# Patient Record
Sex: Female | Born: 1935 | Race: White | Hispanic: No | Marital: Single | Smoking: Never smoker
Health system: Southern US, Community
[De-identification: ages and names within clinical notes are randomized; demographics above are authoritative.]

## PROBLEM LIST (undated history)

## (undated) DIAGNOSIS — C8330 Diffuse large B-cell lymphoma, unspecified site: Secondary | ICD-10-CM

## (undated) DIAGNOSIS — N189 Chronic kidney disease, unspecified: Secondary | ICD-10-CM

## (undated) DIAGNOSIS — I10 Essential (primary) hypertension: Secondary | ICD-10-CM

## (undated) DIAGNOSIS — M109 Gout, unspecified: Secondary | ICD-10-CM

## (undated) DIAGNOSIS — E039 Hypothyroidism, unspecified: Secondary | ICD-10-CM

## (undated) HISTORY — DX: Essential (primary) hypertension: I10

## (undated) HISTORY — PX: OTHER SURGICAL HISTORY: SHX169

## (undated) HISTORY — DX: Gout, unspecified: M10.9

## (undated) HISTORY — DX: Hypothyroidism, unspecified: E03.9

## (undated) HISTORY — DX: Diffuse large B-cell lymphoma, unspecified site: C83.30

## (undated) HISTORY — DX: Chronic kidney disease, unspecified: N18.9

---

## 2014-05-07 ENCOUNTER — Encounter (HOSPITAL_COMMUNITY): Payer: Self-pay | Admitting: Pharmacy Technician

## 2014-05-09 NOTE — Discharge Instructions (Signed)
Cristina Gregory  05/09/2014     Instructions    Activity: No Restrictions.   Diet: Resume Diet you were on at home.   Pain Medication: Tylenol if Needed.   CONTACT YOUR DOCTOR IF YOU HAVE PAIN, REDNESS IN YOUR EYE, OR DECREASED VISION.   Follow-up:06/03/2014 at 2:45  with Williams Che, MD.    Dr. Iona Hansen: [phone]           If you find that you cannot contact your physician, but feel that your signs and   Symptoms warrant a physician's attention, call the Emergency Room at   [phone] ext.[phone].

## 2014-05-12 ENCOUNTER — Ambulatory Visit (HOSPITAL_COMMUNITY)
Admission: RE | Admit: 2014-05-12 | Discharge: 2014-05-12 | Disposition: A | Discharge status: Home / Self Care | Payer: PRIVATE HEALTH INSURANCE | Source: Ambulatory Visit | Attending: Ophthalmology | Admitting: Ophthalmology

## 2014-05-12 ENCOUNTER — Encounter (HOSPITAL_COMMUNITY)
Admission: RE | Disposition: A | Discharge status: Home / Self Care | Payer: Self-pay | Source: Ambulatory Visit | Attending: Ophthalmology

## 2014-05-12 ENCOUNTER — Encounter (HOSPITAL_COMMUNITY): Payer: Self-pay | Admitting: *Deleted

## 2014-05-12 DIAGNOSIS — H4089 Other specified glaucoma: Secondary | ICD-10-CM | POA: Insufficient documentation

## 2014-05-12 HISTORY — PX: SLT LASER APPLICATION: SHX6099

## 2014-05-12 SURGERY — SLT LASER APPLICATION
Anesthesia: LOCAL | Laterality: Right

## 2014-05-12 MED ORDER — APRACLONIDINE HCL 1 % OP SOLN
1.0000 [drp] | OPHTHALMIC | Status: AC
  Administered 2014-05-12 (×3): 1 [drp] via OPHTHALMIC

## 2014-05-12 MED ORDER — PILOCARPINE HCL 1 % OP SOLN
OPHTHALMIC | Status: AC
  Filled 2014-05-12: qty 15

## 2014-05-12 MED ORDER — APRACLONIDINE HCL 1 % OP SOLN
OPHTHALMIC | Status: AC
  Filled 2014-05-12: qty 0.1

## 2014-05-12 MED ORDER — TETRACAINE HCL 0.5 % OP SOLN
1.0000 [drp] | Freq: Once | OPHTHALMIC | Status: AC
  Administered 2014-05-12: 1 [drp] via OPHTHALMIC

## 2014-05-12 MED ORDER — PILOCARPINE HCL 1 % OP SOLN
2.0000 [drp] | Freq: Once | OPHTHALMIC | Status: AC
  Administered 2014-05-12: 2 [drp] via OPHTHALMIC

## 2014-05-12 MED ORDER — TETRACAINE HCL 0.5 % OP SOLN
OPHTHALMIC | Status: AC
  Filled 2014-05-12: qty 2

## 2014-05-12 NOTE — Brief Op Note (Signed)
Cristina Gregory 05/12/2014  Williams Che, MD  Yag Laser Self Test CompletedYes.  . Procedure: SLT, right eye.  Eye Protection Worn by Staff Yes.  . Laser In Use Sign on Door Yes.  .  Laser: Nd:YAG Spot Size: Fixed Power Setting: 0.9 mJ/burst Position treated: 360 degrees Number of shots: 92 Total energy delivered: 82.8 mJ  The patient tolerated the procedure without difficulty. No complications were encountered.  Tenometer reading immediately after procedure: 22  mmHg.  The patient was discharged home with the instructions to continue all her current glaucoma medications, if any.   Patient verbalizes understanding of discharge instructions Yes.  .   Notes:gonioscopy:  grade 2 plateau 360 degrees,  mild pigmentation in angle, no recessions or synechiae

## 2014-05-12 NOTE — Progress Notes (Signed)
Tono-Pen XL pressure reading 22

## 2014-05-12 NOTE — H&P (Signed)
I have reviewed the pre printed H&P, the patient was re-examined, and I have identified no significant interval changes in the patient's medical condition.  There is no change in the plan of care since the history and physical of record. 

## 2014-05-15 ENCOUNTER — Encounter (HOSPITAL_COMMUNITY): Payer: Self-pay | Admitting: Ophthalmology

## 2014-11-03 DIAGNOSIS — E780 Pure hypercholesterolemia: Secondary | ICD-10-CM | POA: Diagnosis not present

## 2014-11-03 DIAGNOSIS — I10 Essential (primary) hypertension: Secondary | ICD-10-CM | POA: Diagnosis not present

## 2014-11-03 DIAGNOSIS — E1129 Type 2 diabetes mellitus with other diabetic kidney complication: Secondary | ICD-10-CM | POA: Diagnosis not present

## 2014-11-03 DIAGNOSIS — Z7689 Persons encountering health services in other specified circumstances: Secondary | ICD-10-CM | POA: Diagnosis not present

## 2014-11-03 DIAGNOSIS — E039 Hypothyroidism, unspecified: Secondary | ICD-10-CM | POA: Diagnosis not present

## 2014-11-03 DIAGNOSIS — N183 Chronic kidney disease, stage 3 (moderate): Secondary | ICD-10-CM | POA: Diagnosis not present

## 2014-11-10 DIAGNOSIS — N189 Chronic kidney disease, unspecified: Secondary | ICD-10-CM | POA: Diagnosis not present

## 2014-11-10 DIAGNOSIS — I10 Essential (primary) hypertension: Secondary | ICD-10-CM | POA: Diagnosis not present

## 2014-11-10 DIAGNOSIS — Z1389 Encounter for screening for other disorder: Secondary | ICD-10-CM | POA: Diagnosis not present

## 2014-11-10 DIAGNOSIS — E039 Hypothyroidism, unspecified: Secondary | ICD-10-CM | POA: Diagnosis not present

## 2014-11-10 DIAGNOSIS — E782 Mixed hyperlipidemia: Secondary | ICD-10-CM | POA: Diagnosis not present

## 2014-11-10 DIAGNOSIS — E1129 Type 2 diabetes mellitus with other diabetic kidney complication: Secondary | ICD-10-CM | POA: Diagnosis not present

## 2015-02-23 DIAGNOSIS — E875 Hyperkalemia: Secondary | ICD-10-CM | POA: Diagnosis not present

## 2015-02-23 DIAGNOSIS — E780 Pure hypercholesterolemia: Secondary | ICD-10-CM | POA: Diagnosis not present

## 2015-02-23 DIAGNOSIS — E039 Hypothyroidism, unspecified: Secondary | ICD-10-CM | POA: Diagnosis not present

## 2015-02-23 DIAGNOSIS — E782 Mixed hyperlipidemia: Secondary | ICD-10-CM | POA: Diagnosis not present

## 2015-02-23 DIAGNOSIS — E1129 Type 2 diabetes mellitus with other diabetic kidney complication: Secondary | ICD-10-CM | POA: Diagnosis not present

## 2015-03-10 DIAGNOSIS — N189 Chronic kidney disease, unspecified: Secondary | ICD-10-CM | POA: Diagnosis not present

## 2015-03-10 DIAGNOSIS — E1129 Type 2 diabetes mellitus with other diabetic kidney complication: Secondary | ICD-10-CM | POA: Diagnosis not present

## 2015-03-10 DIAGNOSIS — E782 Mixed hyperlipidemia: Secondary | ICD-10-CM | POA: Diagnosis not present

## 2015-03-10 DIAGNOSIS — I10 Essential (primary) hypertension: Secondary | ICD-10-CM | POA: Diagnosis not present

## 2015-03-10 DIAGNOSIS — E039 Hypothyroidism, unspecified: Secondary | ICD-10-CM | POA: Diagnosis not present

## 2015-03-11 DIAGNOSIS — E1129 Type 2 diabetes mellitus with other diabetic kidney complication: Secondary | ICD-10-CM | POA: Diagnosis not present

## 2015-03-11 DIAGNOSIS — N189 Chronic kidney disease, unspecified: Secondary | ICD-10-CM | POA: Diagnosis not present

## 2015-03-31 DIAGNOSIS — L049 Acute lymphadenitis, unspecified: Secondary | ICD-10-CM | POA: Diagnosis not present

## 2015-04-09 DIAGNOSIS — E042 Nontoxic multinodular goiter: Secondary | ICD-10-CM | POA: Diagnosis not present

## 2015-04-09 DIAGNOSIS — L049 Acute lymphadenitis, unspecified: Secondary | ICD-10-CM | POA: Diagnosis not present

## 2015-04-11 DIAGNOSIS — E1129 Type 2 diabetes mellitus with other diabetic kidney complication: Secondary | ICD-10-CM | POA: Diagnosis not present

## 2015-04-11 DIAGNOSIS — N189 Chronic kidney disease, unspecified: Secondary | ICD-10-CM | POA: Diagnosis not present

## 2015-04-17 DIAGNOSIS — I700 Atherosclerosis of aorta: Secondary | ICD-10-CM | POA: Diagnosis not present

## 2015-04-17 DIAGNOSIS — I6529 Occlusion and stenosis of unspecified carotid artery: Secondary | ICD-10-CM | POA: Diagnosis not present

## 2015-04-17 DIAGNOSIS — N189 Chronic kidney disease, unspecified: Secondary | ICD-10-CM | POA: Diagnosis not present

## 2015-04-17 DIAGNOSIS — R590 Localized enlarged lymph nodes: Secondary | ICD-10-CM | POA: Diagnosis not present

## 2015-04-17 DIAGNOSIS — R599 Enlarged lymph nodes, unspecified: Secondary | ICD-10-CM | POA: Diagnosis not present

## 2015-04-22 DIAGNOSIS — L049 Acute lymphadenitis, unspecified: Secondary | ICD-10-CM | POA: Diagnosis not present

## 2015-04-29 DIAGNOSIS — R590 Localized enlarged lymph nodes: Secondary | ICD-10-CM | POA: Diagnosis not present

## 2015-05-05 DIAGNOSIS — I129 Hypertensive chronic kidney disease with stage 1 through stage 4 chronic kidney disease, or unspecified chronic kidney disease: Secondary | ICD-10-CM | POA: Diagnosis not present

## 2015-05-05 DIAGNOSIS — Z7902 Long term (current) use of antithrombotics/antiplatelets: Secondary | ICD-10-CM | POA: Diagnosis not present

## 2015-05-05 DIAGNOSIS — Z7982 Long term (current) use of aspirin: Secondary | ICD-10-CM | POA: Diagnosis not present

## 2015-05-05 DIAGNOSIS — R590 Localized enlarged lymph nodes: Secondary | ICD-10-CM | POA: Diagnosis not present

## 2015-05-05 DIAGNOSIS — E039 Hypothyroidism, unspecified: Secondary | ICD-10-CM | POA: Diagnosis not present

## 2015-05-05 DIAGNOSIS — C8331 Diffuse large B-cell lymphoma, lymph nodes of head, face, and neck: Secondary | ICD-10-CM | POA: Diagnosis not present

## 2015-05-05 DIAGNOSIS — Z79899 Other long term (current) drug therapy: Secondary | ICD-10-CM | POA: Diagnosis not present

## 2015-05-05 DIAGNOSIS — Z823 Family history of stroke: Secondary | ICD-10-CM | POA: Diagnosis not present

## 2015-05-05 DIAGNOSIS — E119 Type 2 diabetes mellitus without complications: Secondary | ICD-10-CM | POA: Diagnosis not present

## 2015-05-05 DIAGNOSIS — N183 Chronic kidney disease, stage 3 (moderate): Secondary | ICD-10-CM | POA: Diagnosis not present

## 2015-05-05 DIAGNOSIS — Z8673 Personal history of transient ischemic attack (TIA), and cerebral infarction without residual deficits: Secondary | ICD-10-CM | POA: Diagnosis not present

## 2015-05-05 DIAGNOSIS — Z8249 Family history of ischemic heart disease and other diseases of the circulatory system: Secondary | ICD-10-CM | POA: Diagnosis not present

## 2015-05-06 DIAGNOSIS — Z823 Family history of stroke: Secondary | ICD-10-CM | POA: Diagnosis not present

## 2015-05-06 DIAGNOSIS — D729 Disorder of white blood cells, unspecified: Secondary | ICD-10-CM | POA: Diagnosis not present

## 2015-05-06 DIAGNOSIS — Z8249 Family history of ischemic heart disease and other diseases of the circulatory system: Secondary | ICD-10-CM | POA: Diagnosis not present

## 2015-05-06 DIAGNOSIS — N183 Chronic kidney disease, stage 3 (moderate): Secondary | ICD-10-CM | POA: Diagnosis not present

## 2015-05-06 DIAGNOSIS — E039 Hypothyroidism, unspecified: Secondary | ICD-10-CM | POA: Diagnosis not present

## 2015-05-06 DIAGNOSIS — Z7902 Long term (current) use of antithrombotics/antiplatelets: Secondary | ICD-10-CM | POA: Diagnosis not present

## 2015-05-06 DIAGNOSIS — I10 Essential (primary) hypertension: Secondary | ICD-10-CM | POA: Diagnosis not present

## 2015-05-06 DIAGNOSIS — E119 Type 2 diabetes mellitus without complications: Secondary | ICD-10-CM | POA: Diagnosis not present

## 2015-05-06 DIAGNOSIS — C8510 Unspecified B-cell lymphoma, unspecified site: Secondary | ICD-10-CM | POA: Diagnosis not present

## 2015-05-06 DIAGNOSIS — Z7982 Long term (current) use of aspirin: Secondary | ICD-10-CM | POA: Diagnosis not present

## 2015-05-06 DIAGNOSIS — C8591 Non-Hodgkin lymphoma, unspecified, lymph nodes of head, face, and neck: Secondary | ICD-10-CM | POA: Diagnosis not present

## 2015-05-06 DIAGNOSIS — Z8673 Personal history of transient ischemic attack (TIA), and cerebral infarction without residual deficits: Secondary | ICD-10-CM | POA: Diagnosis not present

## 2015-05-06 DIAGNOSIS — R590 Localized enlarged lymph nodes: Secondary | ICD-10-CM | POA: Diagnosis not present

## 2015-05-06 DIAGNOSIS — Z79899 Other long term (current) drug therapy: Secondary | ICD-10-CM | POA: Diagnosis not present

## 2015-05-06 DIAGNOSIS — I129 Hypertensive chronic kidney disease with stage 1 through stage 4 chronic kidney disease, or unspecified chronic kidney disease: Secondary | ICD-10-CM | POA: Diagnosis not present

## 2015-05-06 DIAGNOSIS — C8330 Diffuse large B-cell lymphoma, unspecified site: Secondary | ICD-10-CM | POA: Diagnosis not present

## 2015-05-06 DIAGNOSIS — R7989 Other specified abnormal findings of blood chemistry: Secondary | ICD-10-CM | POA: Diagnosis not present

## 2015-05-06 DIAGNOSIS — C8331 Diffuse large B-cell lymphoma, lymph nodes of head, face, and neck: Secondary | ICD-10-CM | POA: Diagnosis not present

## 2015-05-06 DIAGNOSIS — R898 Other abnormal findings in specimens from other organs, systems and tissues: Secondary | ICD-10-CM | POA: Diagnosis not present

## 2015-05-11 DIAGNOSIS — E780 Pure hypercholesterolemia: Secondary | ICD-10-CM | POA: Diagnosis not present

## 2015-05-20 DIAGNOSIS — I10 Essential (primary) hypertension: Secondary | ICD-10-CM | POA: Diagnosis not present

## 2015-05-20 DIAGNOSIS — Z8673 Personal history of transient ischemic attack (TIA), and cerebral infarction without residual deficits: Secondary | ICD-10-CM | POA: Diagnosis not present

## 2015-05-20 DIAGNOSIS — C8330 Diffuse large B-cell lymphoma, unspecified site: Secondary | ICD-10-CM | POA: Diagnosis not present

## 2015-05-20 DIAGNOSIS — M109 Gout, unspecified: Secondary | ICD-10-CM | POA: Diagnosis not present

## 2015-05-20 DIAGNOSIS — R7309 Other abnormal glucose: Secondary | ICD-10-CM | POA: Diagnosis not present

## 2015-05-26 DIAGNOSIS — K6389 Other specified diseases of intestine: Secondary | ICD-10-CM | POA: Diagnosis not present

## 2015-05-26 DIAGNOSIS — R911 Solitary pulmonary nodule: Secondary | ICD-10-CM | POA: Diagnosis not present

## 2015-05-26 DIAGNOSIS — C8330 Diffuse large B-cell lymphoma, unspecified site: Secondary | ICD-10-CM | POA: Diagnosis not present

## 2015-05-26 DIAGNOSIS — K828 Other specified diseases of gallbladder: Secondary | ICD-10-CM | POA: Diagnosis not present

## 2015-05-26 DIAGNOSIS — R590 Localized enlarged lymph nodes: Secondary | ICD-10-CM | POA: Diagnosis not present

## 2015-05-27 DIAGNOSIS — M109 Gout, unspecified: Secondary | ICD-10-CM | POA: Diagnosis not present

## 2015-05-27 DIAGNOSIS — R7309 Other abnormal glucose: Secondary | ICD-10-CM | POA: Diagnosis not present

## 2015-05-27 DIAGNOSIS — N289 Disorder of kidney and ureter, unspecified: Secondary | ICD-10-CM | POA: Diagnosis not present

## 2015-05-27 DIAGNOSIS — C8330 Diffuse large B-cell lymphoma, unspecified site: Secondary | ICD-10-CM | POA: Diagnosis not present

## 2015-05-27 DIAGNOSIS — Z8673 Personal history of transient ischemic attack (TIA), and cerebral infarction without residual deficits: Secondary | ICD-10-CM | POA: Diagnosis not present

## 2015-05-28 DIAGNOSIS — I351 Nonrheumatic aortic (valve) insufficiency: Secondary | ICD-10-CM | POA: Diagnosis not present

## 2015-05-28 DIAGNOSIS — R011 Cardiac murmur, unspecified: Secondary | ICD-10-CM | POA: Diagnosis not present

## 2015-05-28 DIAGNOSIS — I517 Cardiomegaly: Secondary | ICD-10-CM | POA: Diagnosis not present

## 2015-05-28 DIAGNOSIS — C8330 Diffuse large B-cell lymphoma, unspecified site: Secondary | ICD-10-CM | POA: Diagnosis not present

## 2015-05-28 DIAGNOSIS — C8190 Hodgkin lymphoma, unspecified, unspecified site: Secondary | ICD-10-CM | POA: Diagnosis not present

## 2015-05-29 DIAGNOSIS — I129 Hypertensive chronic kidney disease with stage 1 through stage 4 chronic kidney disease, or unspecified chronic kidney disease: Secondary | ICD-10-CM | POA: Diagnosis not present

## 2015-05-29 DIAGNOSIS — E780 Pure hypercholesterolemia: Secondary | ICD-10-CM | POA: Diagnosis not present

## 2015-05-29 DIAGNOSIS — Z8249 Family history of ischemic heart disease and other diseases of the circulatory system: Secondary | ICD-10-CM | POA: Diagnosis not present

## 2015-05-29 DIAGNOSIS — Z79899 Other long term (current) drug therapy: Secondary | ICD-10-CM | POA: Diagnosis not present

## 2015-05-29 DIAGNOSIS — Z7902 Long term (current) use of antithrombotics/antiplatelets: Secondary | ICD-10-CM | POA: Diagnosis not present

## 2015-05-29 DIAGNOSIS — N183 Chronic kidney disease, stage 3 (moderate): Secondary | ICD-10-CM | POA: Diagnosis not present

## 2015-05-29 DIAGNOSIS — Z452 Encounter for adjustment and management of vascular access device: Secondary | ICD-10-CM | POA: Diagnosis not present

## 2015-05-29 DIAGNOSIS — E119 Type 2 diabetes mellitus without complications: Secondary | ICD-10-CM | POA: Diagnosis not present

## 2015-05-29 DIAGNOSIS — Z823 Family history of stroke: Secondary | ICD-10-CM | POA: Diagnosis not present

## 2015-05-29 DIAGNOSIS — E039 Hypothyroidism, unspecified: Secondary | ICD-10-CM | POA: Diagnosis not present

## 2015-05-29 DIAGNOSIS — C8590 Non-Hodgkin lymphoma, unspecified, unspecified site: Secondary | ICD-10-CM | POA: Diagnosis not present

## 2015-05-29 DIAGNOSIS — C8510 Unspecified B-cell lymphoma, unspecified site: Secondary | ICD-10-CM | POA: Diagnosis not present

## 2015-05-29 DIAGNOSIS — I10 Essential (primary) hypertension: Secondary | ICD-10-CM | POA: Diagnosis not present

## 2015-05-29 DIAGNOSIS — Z7982 Long term (current) use of aspirin: Secondary | ICD-10-CM | POA: Diagnosis not present

## 2015-05-29 DIAGNOSIS — M109 Gout, unspecified: Secondary | ICD-10-CM | POA: Diagnosis not present

## 2015-06-04 DIAGNOSIS — C8330 Diffuse large B-cell lymphoma, unspecified site: Secondary | ICD-10-CM | POA: Diagnosis not present

## 2015-06-05 DIAGNOSIS — C8330 Diffuse large B-cell lymphoma, unspecified site: Secondary | ICD-10-CM | POA: Diagnosis not present

## 2015-06-05 DIAGNOSIS — R112 Nausea with vomiting, unspecified: Secondary | ICD-10-CM | POA: Diagnosis not present

## 2015-06-08 DIAGNOSIS — C8330 Diffuse large B-cell lymphoma, unspecified site: Secondary | ICD-10-CM | POA: Diagnosis not present

## 2015-06-11 DIAGNOSIS — E780 Pure hypercholesterolemia: Secondary | ICD-10-CM | POA: Diagnosis not present

## 2015-06-12 DIAGNOSIS — C8330 Diffuse large B-cell lymphoma, unspecified site: Secondary | ICD-10-CM | POA: Diagnosis not present

## 2015-06-16 DIAGNOSIS — R112 Nausea with vomiting, unspecified: Secondary | ICD-10-CM | POA: Diagnosis not present

## 2015-06-16 DIAGNOSIS — R110 Nausea: Secondary | ICD-10-CM | POA: Diagnosis not present

## 2015-06-16 DIAGNOSIS — C8330 Diffuse large B-cell lymphoma, unspecified site: Secondary | ICD-10-CM | POA: Diagnosis not present

## 2015-06-16 DIAGNOSIS — E860 Dehydration: Secondary | ICD-10-CM | POA: Diagnosis not present

## 2015-06-25 DIAGNOSIS — N289 Disorder of kidney and ureter, unspecified: Secondary | ICD-10-CM | POA: Diagnosis not present

## 2015-06-25 DIAGNOSIS — C8330 Diffuse large B-cell lymphoma, unspecified site: Secondary | ICD-10-CM | POA: Diagnosis not present

## 2015-06-26 DIAGNOSIS — C8330 Diffuse large B-cell lymphoma, unspecified site: Secondary | ICD-10-CM | POA: Diagnosis not present

## 2015-06-26 DIAGNOSIS — D701 Agranulocytosis secondary to cancer chemotherapy: Secondary | ICD-10-CM | POA: Diagnosis not present

## 2015-07-03 DIAGNOSIS — C801 Malignant (primary) neoplasm, unspecified: Secondary | ICD-10-CM | POA: Diagnosis not present

## 2015-07-03 DIAGNOSIS — C8510 Unspecified B-cell lymphoma, unspecified site: Secondary | ICD-10-CM | POA: Diagnosis not present

## 2015-07-03 DIAGNOSIS — R509 Fever, unspecified: Secondary | ICD-10-CM | POA: Diagnosis not present

## 2015-07-03 DIAGNOSIS — D61810 Antineoplastic chemotherapy induced pancytopenia: Secondary | ICD-10-CM | POA: Diagnosis not present

## 2015-07-03 DIAGNOSIS — D709 Neutropenia, unspecified: Secondary | ICD-10-CM | POA: Diagnosis not present

## 2015-07-03 DIAGNOSIS — I10 Essential (primary) hypertension: Secondary | ICD-10-CM | POA: Diagnosis not present

## 2015-07-03 DIAGNOSIS — D61818 Other pancytopenia: Secondary | ICD-10-CM | POA: Diagnosis not present

## 2015-07-03 DIAGNOSIS — R630 Anorexia: Secondary | ICD-10-CM | POA: Diagnosis not present

## 2015-07-04 DIAGNOSIS — T451X5A Adverse effect of antineoplastic and immunosuppressive drugs, initial encounter: Secondary | ICD-10-CM | POA: Diagnosis not present

## 2015-07-04 DIAGNOSIS — D61818 Other pancytopenia: Secondary | ICD-10-CM | POA: Diagnosis not present

## 2015-07-04 DIAGNOSIS — R262 Difficulty in walking, not elsewhere classified: Secondary | ICD-10-CM | POA: Diagnosis not present

## 2015-07-04 DIAGNOSIS — R630 Anorexia: Secondary | ICD-10-CM | POA: Diagnosis not present

## 2015-07-04 DIAGNOSIS — Z7952 Long term (current) use of systemic steroids: Secondary | ICD-10-CM | POA: Diagnosis not present

## 2015-07-04 DIAGNOSIS — C8510 Unspecified B-cell lymphoma, unspecified site: Secondary | ICD-10-CM | POA: Diagnosis not present

## 2015-07-04 DIAGNOSIS — Z79899 Other long term (current) drug therapy: Secondary | ICD-10-CM | POA: Diagnosis not present

## 2015-07-04 DIAGNOSIS — D709 Neutropenia, unspecified: Secondary | ICD-10-CM | POA: Diagnosis not present

## 2015-07-04 DIAGNOSIS — Z7902 Long term (current) use of antithrombotics/antiplatelets: Secondary | ICD-10-CM | POA: Diagnosis not present

## 2015-07-04 DIAGNOSIS — D61810 Antineoplastic chemotherapy induced pancytopenia: Secondary | ICD-10-CM | POA: Diagnosis not present

## 2015-07-04 DIAGNOSIS — E039 Hypothyroidism, unspecified: Secondary | ICD-10-CM | POA: Diagnosis not present

## 2015-07-04 DIAGNOSIS — C801 Malignant (primary) neoplasm, unspecified: Secondary | ICD-10-CM | POA: Diagnosis not present

## 2015-07-04 DIAGNOSIS — M1A9XX0 Chronic gout, unspecified, without tophus (tophi): Secondary | ICD-10-CM | POA: Diagnosis not present

## 2015-07-04 DIAGNOSIS — R509 Fever, unspecified: Secondary | ICD-10-CM | POA: Diagnosis not present

## 2015-07-04 DIAGNOSIS — Z8673 Personal history of transient ischemic attack (TIA), and cerebral infarction without residual deficits: Secondary | ICD-10-CM | POA: Diagnosis not present

## 2015-07-04 DIAGNOSIS — E780 Pure hypercholesterolemia: Secondary | ICD-10-CM | POA: Diagnosis not present

## 2015-07-04 DIAGNOSIS — I10 Essential (primary) hypertension: Secondary | ICD-10-CM | POA: Diagnosis not present

## 2015-07-04 DIAGNOSIS — C8300 Small cell B-cell lymphoma, unspecified site: Secondary | ICD-10-CM | POA: Diagnosis not present

## 2015-07-04 DIAGNOSIS — D702 Other drug-induced agranulocytosis: Secondary | ICD-10-CM | POA: Diagnosis not present

## 2015-07-04 DIAGNOSIS — Z7982 Long term (current) use of aspirin: Secondary | ICD-10-CM | POA: Diagnosis not present

## 2015-07-04 DIAGNOSIS — M109 Gout, unspecified: Secondary | ICD-10-CM | POA: Diagnosis not present

## 2015-07-04 DIAGNOSIS — E785 Hyperlipidemia, unspecified: Secondary | ICD-10-CM | POA: Diagnosis not present

## 2015-07-04 DIAGNOSIS — M6281 Muscle weakness (generalized): Secondary | ICD-10-CM | POA: Diagnosis not present

## 2015-07-04 DIAGNOSIS — B370 Candidal stomatitis: Secondary | ICD-10-CM | POA: Diagnosis not present

## 2015-07-06 DIAGNOSIS — D61810 Antineoplastic chemotherapy induced pancytopenia: Secondary | ICD-10-CM | POA: Diagnosis not present

## 2015-07-06 DIAGNOSIS — C8330 Diffuse large B-cell lymphoma, unspecified site: Secondary | ICD-10-CM | POA: Diagnosis not present

## 2015-07-06 DIAGNOSIS — M6281 Muscle weakness (generalized): Secondary | ICD-10-CM | POA: Diagnosis not present

## 2015-07-06 DIAGNOSIS — D729 Disorder of white blood cells, unspecified: Secondary | ICD-10-CM | POA: Diagnosis not present

## 2015-07-06 DIAGNOSIS — B370 Candidal stomatitis: Secondary | ICD-10-CM | POA: Diagnosis not present

## 2015-07-06 DIAGNOSIS — C8510 Unspecified B-cell lymphoma, unspecified site: Secondary | ICD-10-CM | POA: Diagnosis not present

## 2015-07-06 DIAGNOSIS — D61818 Other pancytopenia: Secondary | ICD-10-CM | POA: Diagnosis not present

## 2015-07-06 DIAGNOSIS — R262 Difficulty in walking, not elsewhere classified: Secondary | ICD-10-CM | POA: Diagnosis not present

## 2015-07-06 DIAGNOSIS — R5383 Other fatigue: Secondary | ICD-10-CM | POA: Diagnosis not present

## 2015-07-06 DIAGNOSIS — D701 Agranulocytosis secondary to cancer chemotherapy: Secondary | ICD-10-CM | POA: Diagnosis not present

## 2015-07-06 DIAGNOSIS — I639 Cerebral infarction, unspecified: Secondary | ICD-10-CM | POA: Diagnosis not present

## 2015-07-06 DIAGNOSIS — D702 Other drug-induced agranulocytosis: Secondary | ICD-10-CM | POA: Diagnosis not present

## 2015-07-06 DIAGNOSIS — N289 Disorder of kidney and ureter, unspecified: Secondary | ICD-10-CM | POA: Diagnosis not present

## 2015-07-06 DIAGNOSIS — E785 Hyperlipidemia, unspecified: Secondary | ICD-10-CM | POA: Diagnosis not present

## 2015-07-06 DIAGNOSIS — E039 Hypothyroidism, unspecified: Secondary | ICD-10-CM | POA: Diagnosis not present

## 2015-07-06 DIAGNOSIS — C8300 Small cell B-cell lymphoma, unspecified site: Secondary | ICD-10-CM | POA: Diagnosis not present

## 2015-07-06 DIAGNOSIS — M1A9XX0 Chronic gout, unspecified, without tophus (tophi): Secondary | ICD-10-CM | POA: Diagnosis not present

## 2015-07-06 DIAGNOSIS — Z8673 Personal history of transient ischemic attack (TIA), and cerebral infarction without residual deficits: Secondary | ICD-10-CM | POA: Diagnosis not present

## 2015-07-06 DIAGNOSIS — R938 Abnormal findings on diagnostic imaging of other specified body structures: Secondary | ICD-10-CM | POA: Diagnosis not present

## 2015-07-06 DIAGNOSIS — I10 Essential (primary) hypertension: Secondary | ICD-10-CM | POA: Diagnosis not present

## 2015-07-07 DIAGNOSIS — C8510 Unspecified B-cell lymphoma, unspecified site: Secondary | ICD-10-CM | POA: Diagnosis not present

## 2015-07-07 DIAGNOSIS — R5383 Other fatigue: Secondary | ICD-10-CM | POA: Diagnosis not present

## 2015-07-07 DIAGNOSIS — E785 Hyperlipidemia, unspecified: Secondary | ICD-10-CM | POA: Diagnosis not present

## 2015-07-07 DIAGNOSIS — I10 Essential (primary) hypertension: Secondary | ICD-10-CM | POA: Diagnosis not present

## 2015-07-08 ENCOUNTER — Other Ambulatory Visit: Payer: Self-pay | Admitting: *Deleted

## 2015-07-08 ENCOUNTER — Encounter: Payer: Self-pay | Admitting: *Deleted

## 2015-07-08 NOTE — Patient Outreach (Signed)
[facility]) Care Management  07/08/2015  Cristina Gregory 12-24-1934 [id number]   Referral from [place], assigned Jacqlyn Larsen, RN to outreach.  Thanks, Ronnell Freshwater. [place], [place] Assistant Phone: [phone] Fax: [phone]

## 2015-07-08 NOTE — Patient Outreach (Signed)
07/08/15- Referral received from [facility], pt discharged from [facility] on 07/05/15, telephone call to patient, HIIPA verified, pt reports she lives out of state in [place] and has been discharged to skilled nursing facility in [place] (pt thinks this is the name of facility but not absolutely sure) and states she will be there for awhile as she is not able to care for herself at home as she is too weak.  Pt reports she is receiving all her medications at the facility. RN CM ask about speaking to someone at facility and pt "doesn't know" as she is not exactly sure who [facility] is. Case closed due to pt being inpatient at skilled facility.  RN CM faxed letter to primary MD Dr. Quintin Alto and informed him case not opened and pt is at facility.  Jacqlyn Larsen [facility], [place] Coordinator [phone]

## 2015-07-10 NOTE — Patient Outreach (Signed)
[facility]) Care Management  07/10/2015  SHARNELL KNIGHT 1935/09/08 [id number]   Notification from Jacqlyn Larsen, RN due to patient resides in facility in [place].  Thanks, Ronnell Freshwater. [place], [place] Assistant Phone: [phone] Fax: [phone]

## 2015-07-12 DIAGNOSIS — I10 Essential (primary) hypertension: Secondary | ICD-10-CM | POA: Diagnosis not present

## 2015-07-17 DIAGNOSIS — D701 Agranulocytosis secondary to cancer chemotherapy: Secondary | ICD-10-CM | POA: Diagnosis not present

## 2015-07-17 DIAGNOSIS — B370 Candidal stomatitis: Secondary | ICD-10-CM | POA: Diagnosis not present

## 2015-07-17 DIAGNOSIS — D729 Disorder of white blood cells, unspecified: Secondary | ICD-10-CM | POA: Diagnosis not present

## 2015-07-17 DIAGNOSIS — C8330 Diffuse large B-cell lymphoma, unspecified site: Secondary | ICD-10-CM | POA: Diagnosis not present

## 2015-07-21 DIAGNOSIS — E785 Hyperlipidemia, unspecified: Secondary | ICD-10-CM | POA: Diagnosis not present

## 2015-07-21 DIAGNOSIS — C8510 Unspecified B-cell lymphoma, unspecified site: Secondary | ICD-10-CM | POA: Diagnosis not present

## 2015-07-21 DIAGNOSIS — E039 Hypothyroidism, unspecified: Secondary | ICD-10-CM | POA: Diagnosis not present

## 2015-07-21 DIAGNOSIS — B370 Candidal stomatitis: Secondary | ICD-10-CM | POA: Diagnosis not present

## 2015-07-21 DIAGNOSIS — I639 Cerebral infarction, unspecified: Secondary | ICD-10-CM | POA: Diagnosis not present

## 2015-07-23 DIAGNOSIS — Z8673 Personal history of transient ischemic attack (TIA), and cerebral infarction without residual deficits: Secondary | ICD-10-CM | POA: Diagnosis not present

## 2015-07-23 DIAGNOSIS — N289 Disorder of kidney and ureter, unspecified: Secondary | ICD-10-CM | POA: Diagnosis not present

## 2015-07-23 DIAGNOSIS — C8330 Diffuse large B-cell lymphoma, unspecified site: Secondary | ICD-10-CM | POA: Diagnosis not present

## 2015-07-23 DIAGNOSIS — Z5111 Encounter for antineoplastic chemotherapy: Secondary | ICD-10-CM | POA: Diagnosis not present

## 2015-07-23 DIAGNOSIS — D701 Agranulocytosis secondary to cancer chemotherapy: Secondary | ICD-10-CM | POA: Diagnosis not present

## 2015-07-24 DIAGNOSIS — C8330 Diffuse large B-cell lymphoma, unspecified site: Secondary | ICD-10-CM | POA: Diagnosis not present

## 2015-07-24 DIAGNOSIS — D701 Agranulocytosis secondary to cancer chemotherapy: Secondary | ICD-10-CM | POA: Diagnosis not present

## 2015-07-30 DIAGNOSIS — C8330 Diffuse large B-cell lymphoma, unspecified site: Secondary | ICD-10-CM | POA: Diagnosis not present

## 2015-07-31 DIAGNOSIS — D701 Agranulocytosis secondary to cancer chemotherapy: Secondary | ICD-10-CM | POA: Diagnosis not present

## 2015-07-31 DIAGNOSIS — C8330 Diffuse large B-cell lymphoma, unspecified site: Secondary | ICD-10-CM | POA: Diagnosis not present

## 2015-08-03 DIAGNOSIS — N281 Cyst of kidney, acquired: Secondary | ICD-10-CM | POA: Diagnosis not present

## 2015-08-03 DIAGNOSIS — R911 Solitary pulmonary nodule: Secondary | ICD-10-CM | POA: Diagnosis not present

## 2015-08-03 DIAGNOSIS — C8330 Diffuse large B-cell lymphoma, unspecified site: Secondary | ICD-10-CM | POA: Diagnosis not present

## 2015-08-03 DIAGNOSIS — D701 Agranulocytosis secondary to cancer chemotherapy: Secondary | ICD-10-CM | POA: Diagnosis not present

## 2015-08-03 DIAGNOSIS — K6389 Other specified diseases of intestine: Secondary | ICD-10-CM | POA: Diagnosis not present

## 2015-08-03 DIAGNOSIS — E079 Disorder of thyroid, unspecified: Secondary | ICD-10-CM | POA: Diagnosis not present

## 2015-08-04 DIAGNOSIS — Z8673 Personal history of transient ischemic attack (TIA), and cerebral infarction without residual deficits: Secondary | ICD-10-CM | POA: Diagnosis not present

## 2015-08-04 DIAGNOSIS — R948 Abnormal results of function studies of other organs and systems: Secondary | ICD-10-CM | POA: Diagnosis not present

## 2015-08-04 DIAGNOSIS — C8330 Diffuse large B-cell lymphoma, unspecified site: Secondary | ICD-10-CM | POA: Diagnosis not present

## 2015-08-04 DIAGNOSIS — E069 Thyroiditis, unspecified: Secondary | ICD-10-CM | POA: Diagnosis not present

## 2015-08-04 DIAGNOSIS — D701 Agranulocytosis secondary to cancer chemotherapy: Secondary | ICD-10-CM | POA: Diagnosis not present

## 2015-08-11 DIAGNOSIS — E039 Hypothyroidism, unspecified: Secondary | ICD-10-CM | POA: Diagnosis not present

## 2015-08-13 DIAGNOSIS — C8331 Diffuse large B-cell lymphoma, lymph nodes of head, face, and neck: Secondary | ICD-10-CM | POA: Diagnosis not present

## 2015-08-13 DIAGNOSIS — C8330 Diffuse large B-cell lymphoma, unspecified site: Secondary | ICD-10-CM | POA: Diagnosis not present

## 2015-08-14 DIAGNOSIS — D701 Agranulocytosis secondary to cancer chemotherapy: Secondary | ICD-10-CM | POA: Diagnosis not present

## 2015-08-14 DIAGNOSIS — C8330 Diffuse large B-cell lymphoma, unspecified site: Secondary | ICD-10-CM | POA: Diagnosis not present

## 2015-08-14 DIAGNOSIS — T451X5A Adverse effect of antineoplastic and immunosuppressive drugs, initial encounter: Secondary | ICD-10-CM | POA: Diagnosis not present

## 2015-08-20 DIAGNOSIS — C8510 Unspecified B-cell lymphoma, unspecified site: Secondary | ICD-10-CM | POA: Diagnosis not present

## 2015-08-20 DIAGNOSIS — B370 Candidal stomatitis: Secondary | ICD-10-CM | POA: Diagnosis not present

## 2015-08-20 DIAGNOSIS — R1084 Generalized abdominal pain: Secondary | ICD-10-CM | POA: Diagnosis not present

## 2015-08-20 DIAGNOSIS — R531 Weakness: Secondary | ICD-10-CM | POA: Diagnosis not present

## 2015-08-20 DIAGNOSIS — D709 Neutropenia, unspecified: Secondary | ICD-10-CM | POA: Diagnosis not present

## 2015-08-20 DIAGNOSIS — R262 Difficulty in walking, not elsewhere classified: Secondary | ICD-10-CM | POA: Diagnosis not present

## 2015-08-20 DIAGNOSIS — R5081 Fever presenting with conditions classified elsewhere: Secondary | ICD-10-CM | POA: Diagnosis not present

## 2015-08-21 DIAGNOSIS — I129 Hypertensive chronic kidney disease with stage 1 through stage 4 chronic kidney disease, or unspecified chronic kidney disease: Secondary | ICD-10-CM | POA: Diagnosis not present

## 2015-08-21 DIAGNOSIS — R1084 Generalized abdominal pain: Secondary | ICD-10-CM | POA: Diagnosis not present

## 2015-08-21 DIAGNOSIS — C8330 Diffuse large B-cell lymphoma, unspecified site: Secondary | ICD-10-CM | POA: Diagnosis not present

## 2015-08-21 DIAGNOSIS — Z885 Allergy status to narcotic agent status: Secondary | ICD-10-CM | POA: Diagnosis not present

## 2015-08-21 DIAGNOSIS — R5081 Fever presenting with conditions classified elsewhere: Secondary | ICD-10-CM | POA: Diagnosis not present

## 2015-08-21 DIAGNOSIS — Z90710 Acquired absence of both cervix and uterus: Secondary | ICD-10-CM | POA: Diagnosis not present

## 2015-08-21 DIAGNOSIS — A419 Sepsis, unspecified organism: Secondary | ICD-10-CM | POA: Diagnosis not present

## 2015-08-21 DIAGNOSIS — Z7982 Long term (current) use of aspirin: Secondary | ICD-10-CM | POA: Diagnosis not present

## 2015-08-21 DIAGNOSIS — R531 Weakness: Secondary | ICD-10-CM | POA: Diagnosis not present

## 2015-08-21 DIAGNOSIS — D709 Neutropenia, unspecified: Secondary | ICD-10-CM | POA: Diagnosis not present

## 2015-08-21 DIAGNOSIS — C8510 Unspecified B-cell lymphoma, unspecified site: Secondary | ICD-10-CM | POA: Diagnosis not present

## 2015-08-21 DIAGNOSIS — Z7902 Long term (current) use of antithrombotics/antiplatelets: Secondary | ICD-10-CM | POA: Diagnosis not present

## 2015-08-21 DIAGNOSIS — E785 Hyperlipidemia, unspecified: Secondary | ICD-10-CM | POA: Diagnosis not present

## 2015-08-21 DIAGNOSIS — M6281 Muscle weakness (generalized): Secondary | ICD-10-CM | POA: Diagnosis not present

## 2015-08-21 DIAGNOSIS — D6959 Other secondary thrombocytopenia: Secondary | ICD-10-CM | POA: Diagnosis not present

## 2015-08-21 DIAGNOSIS — D701 Agranulocytosis secondary to cancer chemotherapy: Secondary | ICD-10-CM | POA: Diagnosis not present

## 2015-08-21 DIAGNOSIS — Z8673 Personal history of transient ischemic attack (TIA), and cerebral infarction without residual deficits: Secondary | ICD-10-CM | POA: Diagnosis not present

## 2015-08-21 DIAGNOSIS — N189 Chronic kidney disease, unspecified: Secondary | ICD-10-CM | POA: Diagnosis not present

## 2015-08-21 DIAGNOSIS — B370 Candidal stomatitis: Secondary | ICD-10-CM | POA: Diagnosis not present

## 2015-08-21 DIAGNOSIS — M109 Gout, unspecified: Secondary | ICD-10-CM | POA: Diagnosis not present

## 2015-08-21 DIAGNOSIS — Z2821 Immunization not carried out because of patient refusal: Secondary | ICD-10-CM | POA: Diagnosis not present

## 2015-08-21 DIAGNOSIS — Z79891 Long term (current) use of opiate analgesic: Secondary | ICD-10-CM | POA: Diagnosis not present

## 2015-08-21 DIAGNOSIS — Z79899 Other long term (current) drug therapy: Secondary | ICD-10-CM | POA: Diagnosis not present

## 2015-08-21 DIAGNOSIS — E039 Hypothyroidism, unspecified: Secondary | ICD-10-CM | POA: Diagnosis not present

## 2015-08-21 DIAGNOSIS — Z7952 Long term (current) use of systemic steroids: Secondary | ICD-10-CM | POA: Diagnosis not present

## 2015-08-25 DIAGNOSIS — R7989 Other specified abnormal findings of blood chemistry: Secondary | ICD-10-CM | POA: Diagnosis not present

## 2015-08-25 DIAGNOSIS — E876 Hypokalemia: Secondary | ICD-10-CM | POA: Diagnosis not present

## 2015-08-25 DIAGNOSIS — N289 Disorder of kidney and ureter, unspecified: Secondary | ICD-10-CM | POA: Diagnosis not present

## 2015-08-25 DIAGNOSIS — C8330 Diffuse large B-cell lymphoma, unspecified site: Secondary | ICD-10-CM | POA: Diagnosis not present

## 2015-08-25 DIAGNOSIS — D729 Disorder of white blood cells, unspecified: Secondary | ICD-10-CM | POA: Diagnosis not present

## 2015-08-26 DIAGNOSIS — D709 Neutropenia, unspecified: Secondary | ICD-10-CM | POA: Diagnosis not present

## 2015-08-26 DIAGNOSIS — C8590 Non-Hodgkin lymphoma, unspecified, unspecified site: Secondary | ICD-10-CM | POA: Diagnosis not present

## 2015-08-26 DIAGNOSIS — D61818 Other pancytopenia: Secondary | ICD-10-CM | POA: Diagnosis not present

## 2015-08-26 DIAGNOSIS — M6281 Muscle weakness (generalized): Secondary | ICD-10-CM | POA: Diagnosis not present

## 2015-08-27 DIAGNOSIS — D709 Neutropenia, unspecified: Secondary | ICD-10-CM | POA: Diagnosis not present

## 2015-08-27 DIAGNOSIS — D61818 Other pancytopenia: Secondary | ICD-10-CM | POA: Diagnosis not present

## 2015-08-27 DIAGNOSIS — C8590 Non-Hodgkin lymphoma, unspecified, unspecified site: Secondary | ICD-10-CM | POA: Diagnosis not present

## 2015-08-27 DIAGNOSIS — M6281 Muscle weakness (generalized): Secondary | ICD-10-CM | POA: Diagnosis not present

## 2015-08-28 DIAGNOSIS — D61818 Other pancytopenia: Secondary | ICD-10-CM | POA: Diagnosis not present

## 2015-08-28 DIAGNOSIS — M6281 Muscle weakness (generalized): Secondary | ICD-10-CM | POA: Diagnosis not present

## 2015-08-28 DIAGNOSIS — C8590 Non-Hodgkin lymphoma, unspecified, unspecified site: Secondary | ICD-10-CM | POA: Diagnosis not present

## 2015-08-28 DIAGNOSIS — D709 Neutropenia, unspecified: Secondary | ICD-10-CM | POA: Diagnosis not present

## 2015-08-31 DIAGNOSIS — B370 Candidal stomatitis: Secondary | ICD-10-CM | POA: Diagnosis not present

## 2015-08-31 DIAGNOSIS — Z8673 Personal history of transient ischemic attack (TIA), and cerebral infarction without residual deficits: Secondary | ICD-10-CM | POA: Diagnosis not present

## 2015-08-31 DIAGNOSIS — R7989 Other specified abnormal findings of blood chemistry: Secondary | ICD-10-CM | POA: Diagnosis not present

## 2015-08-31 DIAGNOSIS — D729 Disorder of white blood cells, unspecified: Secondary | ICD-10-CM | POA: Diagnosis not present

## 2015-08-31 DIAGNOSIS — D701 Agranulocytosis secondary to cancer chemotherapy: Secondary | ICD-10-CM | POA: Diagnosis not present

## 2015-08-31 DIAGNOSIS — R531 Weakness: Secondary | ICD-10-CM | POA: Diagnosis not present

## 2015-08-31 DIAGNOSIS — C8330 Diffuse large B-cell lymphoma, unspecified site: Secondary | ICD-10-CM | POA: Diagnosis not present

## 2015-09-01 DIAGNOSIS — C8590 Non-Hodgkin lymphoma, unspecified, unspecified site: Secondary | ICD-10-CM | POA: Diagnosis not present

## 2015-09-01 DIAGNOSIS — M6281 Muscle weakness (generalized): Secondary | ICD-10-CM | POA: Diagnosis not present

## 2015-09-01 DIAGNOSIS — D61818 Other pancytopenia: Secondary | ICD-10-CM | POA: Diagnosis not present

## 2015-09-01 DIAGNOSIS — D709 Neutropenia, unspecified: Secondary | ICD-10-CM | POA: Diagnosis not present

## 2015-09-04 DIAGNOSIS — M6281 Muscle weakness (generalized): Secondary | ICD-10-CM | POA: Diagnosis not present

## 2015-09-04 DIAGNOSIS — D61818 Other pancytopenia: Secondary | ICD-10-CM | POA: Diagnosis not present

## 2015-09-04 DIAGNOSIS — D709 Neutropenia, unspecified: Secondary | ICD-10-CM | POA: Diagnosis not present

## 2015-09-04 DIAGNOSIS — C8590 Non-Hodgkin lymphoma, unspecified, unspecified site: Secondary | ICD-10-CM | POA: Diagnosis not present

## 2015-09-08 DIAGNOSIS — D61818 Other pancytopenia: Secondary | ICD-10-CM | POA: Diagnosis not present

## 2015-09-08 DIAGNOSIS — D709 Neutropenia, unspecified: Secondary | ICD-10-CM | POA: Diagnosis not present

## 2015-09-08 DIAGNOSIS — C8590 Non-Hodgkin lymphoma, unspecified, unspecified site: Secondary | ICD-10-CM | POA: Diagnosis not present

## 2015-09-08 DIAGNOSIS — M6281 Muscle weakness (generalized): Secondary | ICD-10-CM | POA: Diagnosis not present

## 2015-09-09 DIAGNOSIS — D709 Neutropenia, unspecified: Secondary | ICD-10-CM | POA: Diagnosis not present

## 2015-09-09 DIAGNOSIS — D61818 Other pancytopenia: Secondary | ICD-10-CM | POA: Diagnosis not present

## 2015-09-09 DIAGNOSIS — C8590 Non-Hodgkin lymphoma, unspecified, unspecified site: Secondary | ICD-10-CM | POA: Diagnosis not present

## 2015-09-09 DIAGNOSIS — M6281 Muscle weakness (generalized): Secondary | ICD-10-CM | POA: Diagnosis not present

## 2015-09-11 DIAGNOSIS — D709 Neutropenia, unspecified: Secondary | ICD-10-CM | POA: Diagnosis not present

## 2015-09-11 DIAGNOSIS — I10 Essential (primary) hypertension: Secondary | ICD-10-CM | POA: Diagnosis not present

## 2015-09-11 DIAGNOSIS — E039 Hypothyroidism, unspecified: Secondary | ICD-10-CM | POA: Diagnosis not present

## 2015-09-11 DIAGNOSIS — M6281 Muscle weakness (generalized): Secondary | ICD-10-CM | POA: Diagnosis not present

## 2015-09-11 DIAGNOSIS — C8590 Non-Hodgkin lymphoma, unspecified, unspecified site: Secondary | ICD-10-CM | POA: Diagnosis not present

## 2015-09-11 DIAGNOSIS — D61818 Other pancytopenia: Secondary | ICD-10-CM | POA: Diagnosis not present

## 2015-09-14 DIAGNOSIS — R531 Weakness: Secondary | ICD-10-CM | POA: Diagnosis not present

## 2015-09-14 DIAGNOSIS — C8330 Diffuse large B-cell lymphoma, unspecified site: Secondary | ICD-10-CM | POA: Diagnosis not present

## 2015-09-15 DIAGNOSIS — C8590 Non-Hodgkin lymphoma, unspecified, unspecified site: Secondary | ICD-10-CM | POA: Diagnosis not present

## 2015-09-15 DIAGNOSIS — D61818 Other pancytopenia: Secondary | ICD-10-CM | POA: Diagnosis not present

## 2015-09-15 DIAGNOSIS — M6281 Muscle weakness (generalized): Secondary | ICD-10-CM | POA: Diagnosis not present

## 2015-09-15 DIAGNOSIS — D709 Neutropenia, unspecified: Secondary | ICD-10-CM | POA: Diagnosis not present

## 2015-09-15 DIAGNOSIS — C320 Malignant neoplasm of glottis: Secondary | ICD-10-CM | POA: Diagnosis not present

## 2015-09-16 DIAGNOSIS — C8331 Diffuse large B-cell lymphoma, lymph nodes of head, face, and neck: Secondary | ICD-10-CM | POA: Diagnosis not present

## 2015-09-16 DIAGNOSIS — I10 Essential (primary) hypertension: Secondary | ICD-10-CM | POA: Diagnosis not present

## 2015-09-16 DIAGNOSIS — D709 Neutropenia, unspecified: Secondary | ICD-10-CM | POA: Diagnosis not present

## 2015-09-16 DIAGNOSIS — C320 Malignant neoplasm of glottis: Secondary | ICD-10-CM | POA: Diagnosis not present

## 2015-09-16 DIAGNOSIS — Z885 Allergy status to narcotic agent status: Secondary | ICD-10-CM | POA: Diagnosis not present

## 2015-09-16 DIAGNOSIS — I69354 Hemiplegia and hemiparesis following cerebral infarction affecting left non-dominant side: Secondary | ICD-10-CM | POA: Diagnosis not present

## 2015-09-16 DIAGNOSIS — D61818 Other pancytopenia: Secondary | ICD-10-CM | POA: Diagnosis not present

## 2015-09-16 DIAGNOSIS — C8590 Non-Hodgkin lymphoma, unspecified, unspecified site: Secondary | ICD-10-CM | POA: Diagnosis not present

## 2015-09-16 DIAGNOSIS — Z800 Family history of malignant neoplasm of digestive organs: Secondary | ICD-10-CM | POA: Diagnosis not present

## 2015-09-16 DIAGNOSIS — M6281 Muscle weakness (generalized): Secondary | ICD-10-CM | POA: Diagnosis not present

## 2015-09-16 DIAGNOSIS — E119 Type 2 diabetes mellitus without complications: Secondary | ICD-10-CM | POA: Diagnosis not present

## 2015-09-18 DIAGNOSIS — D709 Neutropenia, unspecified: Secondary | ICD-10-CM | POA: Diagnosis not present

## 2015-09-18 DIAGNOSIS — D61818 Other pancytopenia: Secondary | ICD-10-CM | POA: Diagnosis not present

## 2015-09-18 DIAGNOSIS — M6281 Muscle weakness (generalized): Secondary | ICD-10-CM | POA: Diagnosis not present

## 2015-09-18 DIAGNOSIS — C8590 Non-Hodgkin lymphoma, unspecified, unspecified site: Secondary | ICD-10-CM | POA: Diagnosis not present

## 2015-09-21 DIAGNOSIS — C320 Malignant neoplasm of glottis: Secondary | ICD-10-CM | POA: Diagnosis not present

## 2015-09-22 DIAGNOSIS — Z885 Allergy status to narcotic agent status: Secondary | ICD-10-CM | POA: Diagnosis not present

## 2015-09-22 DIAGNOSIS — I10 Essential (primary) hypertension: Secondary | ICD-10-CM | POA: Diagnosis not present

## 2015-09-22 DIAGNOSIS — E119 Type 2 diabetes mellitus without complications: Secondary | ICD-10-CM | POA: Diagnosis not present

## 2015-09-22 DIAGNOSIS — C8331 Diffuse large B-cell lymphoma, lymph nodes of head, face, and neck: Secondary | ICD-10-CM | POA: Diagnosis not present

## 2015-09-22 DIAGNOSIS — C320 Malignant neoplasm of glottis: Secondary | ICD-10-CM | POA: Diagnosis not present

## 2015-09-22 DIAGNOSIS — Z800 Family history of malignant neoplasm of digestive organs: Secondary | ICD-10-CM | POA: Diagnosis not present

## 2015-09-22 DIAGNOSIS — I69354 Hemiplegia and hemiparesis following cerebral infarction affecting left non-dominant side: Secondary | ICD-10-CM | POA: Diagnosis not present

## 2015-09-29 DIAGNOSIS — Z510 Encounter for antineoplastic radiation therapy: Secondary | ICD-10-CM | POA: Diagnosis not present

## 2015-09-29 DIAGNOSIS — C320 Malignant neoplasm of glottis: Secondary | ICD-10-CM | POA: Diagnosis not present

## 2015-09-29 DIAGNOSIS — C8330 Diffuse large B-cell lymphoma, unspecified site: Secondary | ICD-10-CM | POA: Diagnosis not present

## 2015-09-29 DIAGNOSIS — C8331 Diffuse large B-cell lymphoma, lymph nodes of head, face, and neck: Secondary | ICD-10-CM | POA: Diagnosis not present

## 2015-09-30 DIAGNOSIS — C8331 Diffuse large B-cell lymphoma, lymph nodes of head, face, and neck: Secondary | ICD-10-CM | POA: Diagnosis not present

## 2015-09-30 DIAGNOSIS — Z510 Encounter for antineoplastic radiation therapy: Secondary | ICD-10-CM | POA: Diagnosis not present

## 2015-10-01 DIAGNOSIS — C8331 Diffuse large B-cell lymphoma, lymph nodes of head, face, and neck: Secondary | ICD-10-CM | POA: Diagnosis not present

## 2015-10-01 DIAGNOSIS — Z510 Encounter for antineoplastic radiation therapy: Secondary | ICD-10-CM | POA: Diagnosis not present

## 2015-10-02 DIAGNOSIS — Z510 Encounter for antineoplastic radiation therapy: Secondary | ICD-10-CM | POA: Diagnosis not present

## 2015-10-02 DIAGNOSIS — C8331 Diffuse large B-cell lymphoma, lymph nodes of head, face, and neck: Secondary | ICD-10-CM | POA: Diagnosis not present

## 2015-10-05 DIAGNOSIS — Z510 Encounter for antineoplastic radiation therapy: Secondary | ICD-10-CM | POA: Diagnosis not present

## 2015-10-05 DIAGNOSIS — C8330 Diffuse large B-cell lymphoma, unspecified site: Secondary | ICD-10-CM | POA: Diagnosis not present

## 2015-10-05 DIAGNOSIS — R531 Weakness: Secondary | ICD-10-CM | POA: Diagnosis not present

## 2015-10-05 DIAGNOSIS — C8331 Diffuse large B-cell lymphoma, lymph nodes of head, face, and neck: Secondary | ICD-10-CM | POA: Diagnosis not present

## 2015-10-05 DIAGNOSIS — Z8673 Personal history of transient ischemic attack (TIA), and cerebral infarction without residual deficits: Secondary | ICD-10-CM | POA: Diagnosis not present

## 2015-10-06 DIAGNOSIS — C8331 Diffuse large B-cell lymphoma, lymph nodes of head, face, and neck: Secondary | ICD-10-CM | POA: Diagnosis not present

## 2015-10-06 DIAGNOSIS — C320 Malignant neoplasm of glottis: Secondary | ICD-10-CM | POA: Diagnosis not present

## 2015-10-06 DIAGNOSIS — Z510 Encounter for antineoplastic radiation therapy: Secondary | ICD-10-CM | POA: Diagnosis not present

## 2015-10-07 DIAGNOSIS — Z510 Encounter for antineoplastic radiation therapy: Secondary | ICD-10-CM | POA: Diagnosis not present

## 2015-10-07 DIAGNOSIS — C8331 Diffuse large B-cell lymphoma, lymph nodes of head, face, and neck: Secondary | ICD-10-CM | POA: Diagnosis not present

## 2015-10-08 DIAGNOSIS — Z510 Encounter for antineoplastic radiation therapy: Secondary | ICD-10-CM | POA: Diagnosis not present

## 2015-10-08 DIAGNOSIS — C8331 Diffuse large B-cell lymphoma, lymph nodes of head, face, and neck: Secondary | ICD-10-CM | POA: Diagnosis not present

## 2015-10-09 DIAGNOSIS — Z510 Encounter for antineoplastic radiation therapy: Secondary | ICD-10-CM | POA: Diagnosis not present

## 2015-10-09 DIAGNOSIS — C8331 Diffuse large B-cell lymphoma, lymph nodes of head, face, and neck: Secondary | ICD-10-CM | POA: Diagnosis not present

## 2015-10-11 DIAGNOSIS — E119 Type 2 diabetes mellitus without complications: Secondary | ICD-10-CM | POA: Diagnosis not present

## 2015-10-11 DIAGNOSIS — I639 Cerebral infarction, unspecified: Secondary | ICD-10-CM | POA: Diagnosis not present

## 2015-10-12 DIAGNOSIS — C8331 Diffuse large B-cell lymphoma, lymph nodes of head, face, and neck: Secondary | ICD-10-CM | POA: Diagnosis not present

## 2015-10-12 DIAGNOSIS — Z510 Encounter for antineoplastic radiation therapy: Secondary | ICD-10-CM | POA: Diagnosis not present

## 2015-10-13 DIAGNOSIS — C320 Malignant neoplasm of glottis: Secondary | ICD-10-CM | POA: Diagnosis not present

## 2015-10-13 DIAGNOSIS — C8331 Diffuse large B-cell lymphoma, lymph nodes of head, face, and neck: Secondary | ICD-10-CM | POA: Diagnosis not present

## 2015-10-13 DIAGNOSIS — Z510 Encounter for antineoplastic radiation therapy: Secondary | ICD-10-CM | POA: Diagnosis not present

## 2015-10-14 DIAGNOSIS — C8331 Diffuse large B-cell lymphoma, lymph nodes of head, face, and neck: Secondary | ICD-10-CM | POA: Diagnosis not present

## 2015-10-14 DIAGNOSIS — Z510 Encounter for antineoplastic radiation therapy: Secondary | ICD-10-CM | POA: Diagnosis not present

## 2015-10-15 DIAGNOSIS — Z510 Encounter for antineoplastic radiation therapy: Secondary | ICD-10-CM | POA: Diagnosis not present

## 2015-10-15 DIAGNOSIS — C8331 Diffuse large B-cell lymphoma, lymph nodes of head, face, and neck: Secondary | ICD-10-CM | POA: Diagnosis not present

## 2015-10-16 DIAGNOSIS — C8331 Diffuse large B-cell lymphoma, lymph nodes of head, face, and neck: Secondary | ICD-10-CM | POA: Diagnosis not present

## 2015-10-16 DIAGNOSIS — Z510 Encounter for antineoplastic radiation therapy: Secondary | ICD-10-CM | POA: Diagnosis not present

## 2015-10-20 DIAGNOSIS — C8331 Diffuse large B-cell lymphoma, lymph nodes of head, face, and neck: Secondary | ICD-10-CM | POA: Diagnosis not present

## 2015-10-20 DIAGNOSIS — Z510 Encounter for antineoplastic radiation therapy: Secondary | ICD-10-CM | POA: Diagnosis not present

## 2015-10-21 DIAGNOSIS — Z510 Encounter for antineoplastic radiation therapy: Secondary | ICD-10-CM | POA: Diagnosis not present

## 2015-10-21 DIAGNOSIS — C320 Malignant neoplasm of glottis: Secondary | ICD-10-CM | POA: Diagnosis not present

## 2015-10-21 DIAGNOSIS — C8331 Diffuse large B-cell lymphoma, lymph nodes of head, face, and neck: Secondary | ICD-10-CM | POA: Diagnosis not present

## 2015-10-22 DIAGNOSIS — C8331 Diffuse large B-cell lymphoma, lymph nodes of head, face, and neck: Secondary | ICD-10-CM | POA: Diagnosis not present

## 2015-10-22 DIAGNOSIS — Z510 Encounter for antineoplastic radiation therapy: Secondary | ICD-10-CM | POA: Diagnosis not present

## 2015-10-23 DIAGNOSIS — C8331 Diffuse large B-cell lymphoma, lymph nodes of head, face, and neck: Secondary | ICD-10-CM | POA: Diagnosis not present

## 2015-10-23 DIAGNOSIS — Z510 Encounter for antineoplastic radiation therapy: Secondary | ICD-10-CM | POA: Diagnosis not present

## 2015-10-27 DIAGNOSIS — C8331 Diffuse large B-cell lymphoma, lymph nodes of head, face, and neck: Secondary | ICD-10-CM | POA: Diagnosis not present

## 2015-10-27 DIAGNOSIS — Z510 Encounter for antineoplastic radiation therapy: Secondary | ICD-10-CM | POA: Diagnosis not present

## 2015-10-28 DIAGNOSIS — Z510 Encounter for antineoplastic radiation therapy: Secondary | ICD-10-CM | POA: Diagnosis not present

## 2015-10-28 DIAGNOSIS — C8331 Diffuse large B-cell lymphoma, lymph nodes of head, face, and neck: Secondary | ICD-10-CM | POA: Diagnosis not present

## 2015-10-29 DIAGNOSIS — Z510 Encounter for antineoplastic radiation therapy: Secondary | ICD-10-CM | POA: Diagnosis not present

## 2015-10-29 DIAGNOSIS — C8331 Diffuse large B-cell lymphoma, lymph nodes of head, face, and neck: Secondary | ICD-10-CM | POA: Diagnosis not present

## 2015-10-29 DIAGNOSIS — C320 Malignant neoplasm of glottis: Secondary | ICD-10-CM | POA: Diagnosis not present

## 2015-10-30 DIAGNOSIS — Z510 Encounter for antineoplastic radiation therapy: Secondary | ICD-10-CM | POA: Diagnosis not present

## 2015-10-30 DIAGNOSIS — C8331 Diffuse large B-cell lymphoma, lymph nodes of head, face, and neck: Secondary | ICD-10-CM | POA: Diagnosis not present

## 2015-11-10 DIAGNOSIS — R531 Weakness: Secondary | ICD-10-CM | POA: Diagnosis not present

## 2015-11-10 DIAGNOSIS — D701 Agranulocytosis secondary to cancer chemotherapy: Secondary | ICD-10-CM | POA: Diagnosis not present

## 2015-11-10 DIAGNOSIS — Z8673 Personal history of transient ischemic attack (TIA), and cerebral infarction without residual deficits: Secondary | ICD-10-CM | POA: Diagnosis not present

## 2015-11-10 DIAGNOSIS — C8330 Diffuse large B-cell lymphoma, unspecified site: Secondary | ICD-10-CM | POA: Diagnosis not present

## 2015-11-10 DIAGNOSIS — Z95828 Presence of other vascular implants and grafts: Secondary | ICD-10-CM | POA: Diagnosis not present

## 2015-11-16 DIAGNOSIS — Z95828 Presence of other vascular implants and grafts: Secondary | ICD-10-CM | POA: Diagnosis not present

## 2015-12-01 DIAGNOSIS — Z923 Personal history of irradiation: Secondary | ICD-10-CM | POA: Diagnosis not present

## 2015-12-01 DIAGNOSIS — C8331 Diffuse large B-cell lymphoma, lymph nodes of head, face, and neck: Secondary | ICD-10-CM | POA: Diagnosis not present

## 2015-12-28 DIAGNOSIS — Z95828 Presence of other vascular implants and grafts: Secondary | ICD-10-CM | POA: Diagnosis not present

## 2016-01-05 DIAGNOSIS — C8330 Diffuse large B-cell lymphoma, unspecified site: Secondary | ICD-10-CM | POA: Diagnosis not present

## 2016-01-05 DIAGNOSIS — Z23 Encounter for immunization: Secondary | ICD-10-CM | POA: Diagnosis not present

## 2016-02-16 DIAGNOSIS — C8330 Diffuse large B-cell lymphoma, unspecified site: Secondary | ICD-10-CM | POA: Diagnosis not present

## 2016-02-16 DIAGNOSIS — Z95828 Presence of other vascular implants and grafts: Secondary | ICD-10-CM | POA: Diagnosis not present

## 2016-03-23 DIAGNOSIS — H2513 Age-related nuclear cataract, bilateral: Secondary | ICD-10-CM | POA: Diagnosis not present

## 2016-03-23 DIAGNOSIS — H401133 Primary open-angle glaucoma, bilateral, severe stage: Secondary | ICD-10-CM | POA: Diagnosis not present

## 2016-03-23 DIAGNOSIS — H538 Other visual disturbances: Secondary | ICD-10-CM | POA: Diagnosis not present

## 2016-03-29 DIAGNOSIS — N289 Disorder of kidney and ureter, unspecified: Secondary | ICD-10-CM | POA: Diagnosis not present

## 2016-03-29 DIAGNOSIS — E860 Dehydration: Secondary | ICD-10-CM | POA: Diagnosis not present

## 2016-03-29 DIAGNOSIS — R7303 Prediabetes: Secondary | ICD-10-CM | POA: Diagnosis not present

## 2016-03-29 DIAGNOSIS — Z95828 Presence of other vascular implants and grafts: Secondary | ICD-10-CM | POA: Diagnosis not present

## 2016-03-29 DIAGNOSIS — C8330 Diffuse large B-cell lymphoma, unspecified site: Secondary | ICD-10-CM | POA: Diagnosis not present

## 2016-04-14 DIAGNOSIS — S82401A Unspecified fracture of shaft of right fibula, initial encounter for closed fracture: Secondary | ICD-10-CM | POA: Diagnosis not present

## 2016-04-15 DIAGNOSIS — S82832A Other fracture of upper and lower end of left fibula, initial encounter for closed fracture: Secondary | ICD-10-CM | POA: Diagnosis not present

## 2016-04-25 DIAGNOSIS — S82401D Unspecified fracture of shaft of right fibula, subsequent encounter for closed fracture with routine healing: Secondary | ICD-10-CM | POA: Diagnosis not present

## 2016-05-09 DIAGNOSIS — S92355D Nondisplaced fracture of fifth metatarsal bone, left foot, subsequent encounter for fracture with routine healing: Secondary | ICD-10-CM | POA: Diagnosis not present

## 2016-05-09 DIAGNOSIS — S82409A Unspecified fracture of shaft of unspecified fibula, initial encounter for closed fracture: Secondary | ICD-10-CM | POA: Diagnosis not present

## 2016-05-09 DIAGNOSIS — Z6821 Body mass index (BMI) 21.0-21.9, adult: Secondary | ICD-10-CM | POA: Diagnosis not present

## 2016-05-11 DIAGNOSIS — Z8572 Personal history of non-Hodgkin lymphomas: Secondary | ICD-10-CM | POA: Diagnosis not present

## 2016-05-11 DIAGNOSIS — N281 Cyst of kidney, acquired: Secondary | ICD-10-CM | POA: Diagnosis not present

## 2016-05-11 DIAGNOSIS — E1129 Type 2 diabetes mellitus with other diabetic kidney complication: Secondary | ICD-10-CM | POA: Diagnosis not present

## 2016-05-11 DIAGNOSIS — R1906 Epigastric swelling, mass or lump: Secondary | ICD-10-CM | POA: Diagnosis not present

## 2016-05-11 DIAGNOSIS — I700 Atherosclerosis of aorta: Secondary | ICD-10-CM | POA: Diagnosis not present

## 2016-05-11 DIAGNOSIS — K5730 Diverticulosis of large intestine without perforation or abscess without bleeding: Secondary | ICD-10-CM | POA: Diagnosis not present

## 2016-05-11 DIAGNOSIS — K5790 Diverticulosis of intestine, part unspecified, without perforation or abscess without bleeding: Secondary | ICD-10-CM | POA: Diagnosis not present

## 2016-05-23 DIAGNOSIS — S82409A Unspecified fracture of shaft of unspecified fibula, initial encounter for closed fracture: Secondary | ICD-10-CM | POA: Diagnosis not present

## 2016-05-23 DIAGNOSIS — Z6821 Body mass index (BMI) 21.0-21.9, adult: Secondary | ICD-10-CM | POA: Diagnosis not present

## 2016-05-23 DIAGNOSIS — S92355D Nondisplaced fracture of fifth metatarsal bone, left foot, subsequent encounter for fracture with routine healing: Secondary | ICD-10-CM | POA: Diagnosis not present

## 2016-06-27 DIAGNOSIS — Z79899 Other long term (current) drug therapy: Secondary | ICD-10-CM | POA: Diagnosis not present

## 2016-06-27 DIAGNOSIS — R1030 Lower abdominal pain, unspecified: Secondary | ICD-10-CM | POA: Diagnosis not present

## 2016-06-27 DIAGNOSIS — Z7982 Long term (current) use of aspirin: Secondary | ICD-10-CM | POA: Diagnosis not present

## 2016-06-27 DIAGNOSIS — E039 Hypothyroidism, unspecified: Secondary | ICD-10-CM | POA: Diagnosis not present

## 2016-06-27 DIAGNOSIS — N10 Acute tubulo-interstitial nephritis: Secondary | ICD-10-CM | POA: Diagnosis not present

## 2016-06-27 DIAGNOSIS — E7800 Pure hypercholesterolemia, unspecified: Secondary | ICD-10-CM | POA: Diagnosis not present

## 2016-06-27 DIAGNOSIS — Z8673 Personal history of transient ischemic attack (TIA), and cerebral infarction without residual deficits: Secondary | ICD-10-CM | POA: Diagnosis not present

## 2016-06-27 DIAGNOSIS — C8590 Non-Hodgkin lymphoma, unspecified, unspecified site: Secondary | ICD-10-CM | POA: Diagnosis not present

## 2016-06-27 DIAGNOSIS — Z7902 Long term (current) use of antithrombotics/antiplatelets: Secondary | ICD-10-CM | POA: Diagnosis not present

## 2016-06-27 DIAGNOSIS — N184 Chronic kidney disease, stage 4 (severe): Secondary | ICD-10-CM | POA: Diagnosis not present

## 2016-06-27 DIAGNOSIS — I129 Hypertensive chronic kidney disease with stage 1 through stage 4 chronic kidney disease, or unspecified chronic kidney disease: Secondary | ICD-10-CM | POA: Diagnosis not present

## 2016-06-29 ENCOUNTER — Ambulatory Visit (HOSPITAL_COMMUNITY): Payer: Medicare Other | Admitting: Oncology

## 2016-06-29 DIAGNOSIS — N3000 Acute cystitis without hematuria: Secondary | ICD-10-CM | POA: Diagnosis not present

## 2016-06-29 DIAGNOSIS — Z23 Encounter for immunization: Secondary | ICD-10-CM | POA: Diagnosis not present

## 2016-06-29 DIAGNOSIS — K5901 Slow transit constipation: Secondary | ICD-10-CM | POA: Diagnosis not present

## 2016-06-29 DIAGNOSIS — Z6822 Body mass index (BMI) 22.0-22.9, adult: Secondary | ICD-10-CM | POA: Diagnosis not present

## 2016-07-13 ENCOUNTER — Encounter (HOSPITAL_COMMUNITY): Payer: Medicare Other | Attending: Oncology | Admitting: Oncology

## 2016-07-13 ENCOUNTER — Encounter (HOSPITAL_COMMUNITY): Payer: Self-pay | Admitting: Oncology

## 2016-07-13 ENCOUNTER — Encounter (HOSPITAL_COMMUNITY): Payer: Medicare Other

## 2016-07-13 VITALS — BP 136/49 | HR 71 | Temp 97.9°F | Resp 18 | Ht 64.0 in | Wt 129.0 lb

## 2016-07-13 DIAGNOSIS — C8331 Diffuse large B-cell lymphoma, lymph nodes of head, face, and neck: Secondary | ICD-10-CM

## 2016-07-13 DIAGNOSIS — Z95828 Presence of other vascular implants and grafts: Secondary | ICD-10-CM

## 2016-07-13 DIAGNOSIS — C8330 Diffuse large B-cell lymphoma, unspecified site: Secondary | ICD-10-CM | POA: Insufficient documentation

## 2016-07-13 HISTORY — DX: Diffuse large B-cell lymphoma, unspecified site: C83.30

## 2016-07-13 LAB — CBC WITH DIFFERENTIAL/PLATELET
Basophils Absolute: 0 10*3/uL (ref 0.0–0.1)
Basophils Relative: 1 %
Eosinophils Absolute: 0.2 10*3/uL (ref 0.0–0.7)
Eosinophils Relative: 2 %
HCT: 40.4 % (ref 36.0–46.0)
Hemoglobin: 13.1 g/dL (ref 12.0–15.0)
LYMPHS PCT: 36 %
Lymphs Abs: 2.9 10*3/uL (ref 0.7–4.0)
MCH: 29.6 pg (ref 26.0–34.0)
MCHC: 32.4 g/dL (ref 30.0–36.0)
MCV: 91.4 fL (ref 78.0–100.0)
MONO ABS: 1 10*3/uL (ref 0.1–1.0)
Monocytes Relative: 12 %
Neutro Abs: 3.9 10*3/uL (ref 1.7–7.7)
Neutrophils Relative %: 49 %
Platelets: 194 10*3/uL (ref 150–400)
RBC: 4.42 MIL/uL (ref 3.87–5.11)
RDW: 14.8 % (ref 11.5–15.5)
WBC: 8 10*3/uL (ref 4.0–10.5)

## 2016-07-13 LAB — LACTATE DEHYDROGENASE: LDH: 128 U/L (ref 98–192)

## 2016-07-13 LAB — COMPREHENSIVE METABOLIC PANEL
ALT: 26 U/L (ref 14–54)
ANION GAP: 6 (ref 5–15)
AST: 23 U/L (ref 15–41)
Albumin: 4.6 g/dL (ref 3.5–5.0)
Alkaline Phosphatase: 132 U/L — ABNORMAL HIGH (ref 38–126)
BUN: 30 mg/dL — ABNORMAL HIGH (ref 6–20)
CHLORIDE: 103 mmol/L (ref 101–111)
CO2: 27 mmol/L (ref 22–32)
Calcium: 9.1 mg/dL (ref 8.9–10.3)
Creatinine, Ser: 1.32 mg/dL — ABNORMAL HIGH (ref 0.44–1.00)
GFR calc Af Amer: 43 mL/min — ABNORMAL LOW (ref >60)
GFR, EST NON AFRICAN AMERICAN: 37 mL/min — AB (ref >60)
Glucose, Bld: 105 mg/dL — ABNORMAL HIGH (ref 65–99)
POTASSIUM: 3.9 mmol/L (ref 3.5–5.1)
Sodium: 136 mmol/L (ref 135–145)
TOTAL PROTEIN: 7.6 g/dL (ref 6.5–8.1)
Total Bilirubin: 0.5 mg/dL (ref 0.3–1.2)

## 2016-07-13 LAB — C-REACTIVE PROTEIN: CRP: 1.2 mg/dL — AB (ref <1.0)

## 2016-07-13 LAB — SEDIMENTATION RATE: Sed Rate: 29 mm/hr — ABNORMAL HIGH (ref 0–22)

## 2016-07-13 MED ORDER — HEPARIN SOD (PORK) LOCK FLUSH 100 UNIT/ML IV SOLN
500.0000 [IU] | Freq: Once | INTRAVENOUS | Status: AC
  Administered 2016-07-13: 500 [IU] via INTRAVENOUS
  Filled 2016-07-13: qty 5

## 2016-07-13 MED ORDER — SODIUM CHLORIDE 0.9% FLUSH
10.0000 mL | INTRAVENOUS | Status: DC | PRN
  Administered 2016-07-13: 10 mL via INTRAVENOUS
  Filled 2016-07-13: qty 10

## 2016-07-13 NOTE — Progress Notes (Signed)
Cristina Gregory presented for Portacath access and flush.  Portacath located left chest wall accessed with  H 20 needle.  Good blood return present. Portacath flushed with 75ml NS and 500U/42ml Heparin and needle removed intact.  Procedure tolerated well and without incident.

## 2016-07-13 NOTE — Patient Instructions (Signed)
[place] at [facility] Discharge Instructions  RECOMMENDATIONS MADE BY THE CONSULTANT AND ANY TEST RESULTS WILL BE SENT TO YOUR REFERRING PHYSICIAN.  Thank you for [place] to continue you medical oncology care.  We are glad to help you. As we discussed, you have an abnormality in your sigmoid colon that was noted on your 05/2015 PET scan and again on your October 2016 PET scan.  This should be evaluated by a gastroenterologist.  We will refer you accordingly to Dr. Gala Romney. We will flush you port today with labs. We will flush your port every 6 weeks. We will see you back for follow-up in 12 weeks. Please call with any questions or concerns regarding your cancer care.  Thank you for choosing [place] at [facility] to provide your oncology and hematology care.  To afford each patient quality time with our provider, please arrive at least 15 minutes before your scheduled appointment time.   Beginning January 23rd 2017 lab work for the [facility] will be done in the  Main lab at [facility] on 1st floor. If you have a lab appointment with the [facility] please come in thru the  Main Entrance and check in at the main information desk  You need to re-schedule your appointment should you arrive 10 or more minutes late.  We strive to give you quality time with our providers, and arriving late affects you and other patients whose appointments are after yours.  Also, if you no show three or more times for appointments you may be dismissed from the clinic at the providers discretion.     Again, thank you for choosing [facility].  Our hope is that these requests will decrease the amount of time that you wait before being seen by our physicians.       _____________________________________________________________  Should you have questions after your visit to [facility], please contact our office at  [phone] between the hours of 8:30 a.m. and 4:30 p.m.  Voicemails left after 4:30 p.m. will not be returned until the following business day.  For prescription refill requests, have your pharmacy contact our office.         Resources For Cancer Patients and their Caregivers ? American Cancer Society: Can assist with transportation, wigs, general needs, runs Look Good Feel Better.        475-540-0190 ? Cancer Care: Provides financial assistance, online support groups, medication/co-pay assistance.  1-800-813-HOPE 936 101 2554) ? [place] Assists [place] cancer patients and their families through emotional , educational and financial support.  (414) 533-6968 ? [place] DSS Where to apply for food stamps, Medicaid and utility assistance. 7697960449 ? [facility]: Transportation to medical appointments. (669) 558-5980 ? Social Security Administration: May apply for disability if have a Stage IV cancer. 951-867-9615 830-465-8452 ? [employer], Disability and Transit Services: Assists with nutrition, care and transit needs. [place] Support Programs: @10RELATIVEDAYS @ > Cancer Support Group  2nd Tuesday of the month 1pm-2pm, Journey Room  > Creative Journey  3rd Tuesday of the month 1130am-1pm, Journey Room  > Look Good Feel Better  1st Wednesday of the month 10am-12 noon, Journey Room (Call [place] to register [phone])

## 2016-07-13 NOTE — Progress Notes (Signed)
See office visit encounter. 

## 2016-07-13 NOTE — Progress Notes (Signed)
[facility] Hematology/Oncology Consultation   Name: Cristina Gregory      MRN: [id number]    Location: Room/bed info not found  Date: 07/13/2016 Time:7:01 PM   REFERRING PHYSICIAN:  Everardo All, MD (Medical Oncology at [facility])  [place]:  Transfer of medical oncology care   DIAGNOSIS:  Stage IIB diffuse large B-cell lymphoma.  HISTORY OF PRESENT ILLNESS:   Cristina Gregory is a 81 y.o. female with a medical history significant for essential hypertension, idiopathic scoliosis of thoracolumbar spine, renal insufficiency, history of cerebrovascular accident, borderline type 2 diabetes mellitus, gout, Port-A-Cath in place, history of positive blood cultures who is referred to the [facility] for transfer of medical oncology care for a history of stage IIB diffuse large B-cell lymphoma treated with 4 cycles of R CHOP (06/04/2015-08/20/2015) with 2 hospitalizations requiring discontinuation of therapy early and foregoing final 2 cycles of therapy (goal of 6 cycles) followed by radiation therapy by Dr. Isidore Moos (09/11/2015-10/30/2015).    DLBCL (diffuse large B cell lymphoma) ([place])   05/06/2015 Initial Diagnosis    DLBCL (diffuse large B cell lymphoma) ([place]).  Diagnosis via right supraclavicular/cervical lymph node biopsy.      05/26/2015 Cancer Staging    Stage IIB      05/26/2015 PET scan    Hypermetabolic right supraclavicular adenopathy. Mild hypermetabolic AP window lymph node. A focal hypermetabolic wall thickening in the mid sigmoid colon, correlate for acute diverticulitis versus mass. No hypermetabolic right lung base nodule, likely too small for evaluation by PET      05/28/2015 Echocardiogram    Left ventricle is normal in size. Mild concentric left ventricular hypertrophy. Left ventricle ejection fraction is normal (60-65 percent). Left ventricular diastolic function is abnormal. There is mild aortic regurgitation.      06/04/2015 -   Chemotherapy    RCHOP, administered by Dr. Tressie Stalker at [facility].  Dose reduction beginning on cycle two.  Due to complications, received 4 cycles of chemotherapy. Her last 2 cycles cancelled.      07/06/2015 Adverse Reaction    Hospitalized with neutropenic fever but negative blood cultures      08/03/2015 Remission    PET scan shows complete remission after 3 cycles of chemotherapy      08/03/2015 PET scan    A near complete resolution of right supraclavicular nodes. No hypermetabolic activity remaining. Stable activity within the left thyroid lobe. Ultrasound for further evaluation if clinically indicated. Stable 3 mm right basilar nodule. Nonspecific activity in the sigmoid colon is increased.      08/20/2015 Adverse Reaction    Hospitalized after cycle 4 with neutropenic fever, thrombocytopenia, and Staphylococcus epidermidis blood culture positive 1      09/11/2015 - 10/30/2015 Radiation Therapy    36 Gy delivered to areas of previous involvement due to inability to tolerate further chemotherapy.        On Memorial Day weekend 2016 she noticed a stiffness in her right neck and when she palpated this area, she noted a mass. The patient waited a couple of weeks but the mass did not improve. She saw her primary care physician who referred her to Dr. Tye [place] who performed a lymph node biopsy.  Biopsy returned showing classical diffuse large B-cell lymphoma.  She was subsequently referred to medical oncology and saw Dr. Tressie Stalker at [facility].    She notes that at time of diagnosis, she developed sweats, drenching sweats.  She otherwise  does not remember any symptoms associated with the palpable mass at time of Dx.  More recently, she reports some issues with constipation.  She was seen by Dr. Quintin Alto who provided her an Rx for stool softeners.  She notes an improvement in her BMs.  She denies any blood in her stools, change in caliber of stools and pencil-thin stools.     Review of Systems    Constitutional: Negative.  Negative for chills, fever, malaise/fatigue and weight loss.  HENT: Negative.   Eyes: Negative.  Negative for blurred vision.  Respiratory: Negative.  Negative for cough, hemoptysis, sputum production and shortness of breath.   Cardiovascular: Negative.  Negative for chest pain and leg swelling.  Gastrointestinal: Positive for constipation. Negative for abdominal pain, blood in stool, diarrhea, melena, nausea and vomiting.  Genitourinary: Negative.  Negative for dysuria.  Musculoskeletal: Positive for back pain.  Skin: Negative.  Negative for rash.  Neurological: Negative.  Negative for weakness.  Endo/Heme/Allergies: Negative.   Psychiatric/Behavioral: The patient is nervous/anxious.     PAST MEDICAL HISTORY:   Past Medical History:  Diagnosis Date  . DLBCL (diffuse large B cell lymphoma) ([place]) 07/13/2016    ALLERGIES: No Known Allergies    MEDICATIONS: I have reviewed the patient's current medications.    Current Outpatient Prescriptions on File Prior to Visit  Medication Sig Dispense Refill  . allopurinol (ZYLOPRIM) 300 MG tablet Take 300 mg by mouth daily.    Marland Kitchen amLODipine (NORVASC) 5 MG tablet Take 5 mg by mouth daily.    . clopidogrel (PLAVIX) 75 MG tablet Take 75 mg by mouth daily.    Marland Kitchen levothyroxine (SYNTHROID, LEVOTHROID) 88 MCG tablet Take 88 mcg by mouth daily before breakfast.    . metoprolol (LOPRESSOR) 50 MG tablet Take 25 mg by mouth 2 (two) times daily.    . simvastatin (ZOCOR) 20 MG tablet Take 20 mg by mouth daily.    . Vitamin D, Ergocalciferol, (DRISDOL) 50000 UNITS CAPS capsule Take 50,000 Units by mouth every 7 (seven) days. Take on Wednesday     No current facility-administered medications on file prior to visit.      PAST SURGICAL HISTORY Past Surgical History:  Procedure Laterality Date  . SLT LASER APPLICATION Right 2/37/6283   Procedure: SLT LASER APPLICATION;  Surgeon: Williams Che, MD;  Location: [facility];  Service:  Ophthalmology;  Laterality: Right;    FAMILY HISTORY: She has 3 biological children and 1 adopted son.  One of her biological children is deceased  SOCIAL HISTORY: She denies tobacco abuse, EtOH abuse, and illicit drug abuse.  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  She is divorced.  Social History   Social History  . Marital status: Single    Spouse name: N/A  . Number of children: N/A  . Years of education: N/A   Social History Main Topics  . Smoking status: Never Smoker  . Smokeless tobacco: Not on file  . Alcohol use Not on file  . Drug use: Unknown  . Sexual activity: Not on file   Other Topics Concern  . Not on file   Social History Narrative  . No narrative on file    PERFORMANCE STATUS: The patient's performance status is 1 - Symptomatic but completely ambulatory  PHYSICAL EXAM: Most Recent Vital Signs: Blood pressure (!) 136/49, pulse 71, temperature 97.9 F (36.6 C), temperature source Oral,  resp. rate 18, height _0  (1.626 m), weight 129 lb (58.5 kg), SpO2 100 %. General appearance: alert, cooperative, appears stated age, no distress and intermittent slow responses to questions secondary to past CVA. Head: Normocephalic, without obvious abnormality, atraumatic Eyes: negative findings: lids and lashes normal, conjunctivae and sclerae normal and corneas clear Nose: Nares normal. Septum midline. Mucosa normal. No drainage or sinus tenderness. Throat: normal findings: lips normal without lesions and oropharynx pink & moist without lesions or evidence of thrush Neck: no adenopathy, supple, symmetrical, trachea midline and thyroid not enlarged, symmetric, no tenderness/mass/nodules Lungs: clear to auscultation bilaterally and normal percussion bilaterally Heart: regular rate and rhythm, S1, S2 normal, no murmur, click, rub or gallop Abdomen: soft, non-tender; bowel sounds  normal; no masses,  no organomegaly Extremities: extremities normal, atraumatic, no cyanosis or edema Skin: Skin color, texture, turgor normal. No rashes or lesions Lymph nodes: Cervical, supraclavicular, and axillary nodes normal. Neurologic: Grossly normal  LABORATORY DATA:  Results for orders placed or performed in visit on 07/13/16 (from the past 48 hour(s))  CBC with Differential     Status: None   Collection Time: 07/13/16  1:04 PM  Result Value Ref Range   WBC 8.0 4.0 - 10.5 K/uL   RBC 4.42 3.87 - 5.11 MIL/uL   Hemoglobin 13.1 12.0 - 15.0 g/dL   HCT 40.4 36.0 - 46.0 %   MCV 91.4 78.0 - 100.0 fL   MCH 29.6 26.0 - 34.0 pg   MCHC 32.4 30.0 - 36.0 g/dL   RDW 14.8 11.5 - 15.5 %   Platelets 194 150 - 400 K/uL   Neutrophils Relative % 49 %   Neutro Abs 3.9 1.7 - 7.7 K/uL   Lymphocytes Relative 36 %   Lymphs Abs 2.9 0.7 - 4.0 K/uL   Monocytes Relative 12 %   Monocytes Absolute 1.0 0.1 - 1.0 K/uL   Eosinophils Relative 2 %   Eosinophils Absolute 0.2 0.0 - 0.7 K/uL   Basophils Relative 1 %   Basophils Absolute 0.0 0.0 - 0.1 K/uL  Comprehensive metabolic panel     Status: Abnormal   Collection Time: 07/13/16  1:04 PM  Result Value Ref Range   Sodium 136 135 - 145 mmol/L   Potassium 3.9 3.5 - 5.1 mmol/L   Chloride 103 101 - 111 mmol/L   CO2 27 22 - 32 mmol/L   Glucose, Bld 105 (H) 65 - 99 mg/dL   BUN 30 (H) 6 - 20 mg/dL   Creatinine, Ser 1.32 (H) 0.44 - 1.00 mg/dL   Calcium 9.1 8.9 - 10.3 mg/dL   Total Protein 7.6 6.5 - 8.1 g/dL   Albumin 4.6 3.5 - 5.0 g/dL   AST 23 15 - 41 U/L   ALT 26 14 - 54 U/L   Alkaline Phosphatase 132 (H) 38 - 126 U/L   Total Bilirubin 0.5 0.3 - 1.2 mg/dL   GFR calc non Af Amer 37 (L) >60 mL/min   GFR calc Af Amer 43 (L) >60 mL/min    Comment: (NOTE) The eGFR has been calculated using the CKD EPI equation. This calculation has not been validated in all clinical situations. eGFR's persistently <60 mL/min signify possible Chronic Kidney Disease.     Anion gap 6 5 - 15  Lactate dehydrogenase     Status: None   Collection Time: 07/13/16  1:04 PM  Result Value Ref Range   LDH 128 98 - 192 U/L  Sedimentation rate     Status:  Abnormal   Collection Time: 07/13/16  1:04 PM  Result Value Ref Range   Sed Rate 29 (H) 0 - 22 mm/hr      RADIOGRAPHY:         PATHOLOGY:  N/A  ASSESSMENT/PLAN:   DLBCL (diffuse large B cell lymphoma) (HCC) Stage IIB diffuse large B-cell lymphoma treated by Dr. Tressie Stalker with R-CHOP 4/6 cycles due to recurrent hospitalizations (two) for febrile neutropenia with last treatment being in October 2016.  She then underwent XRT by Dr. Isidore Moos (09/11/2015- 10/30/2015).  Oncology history developed.  Staging in [facility] problem list completed.  I personally reviewed and went over laboratory results with the patient.  The results are noted within this dictation.  I personally reviewed and went over radiographic studies with the patient.  The results are noted within this dictation.  I have reviewed the patient's pre-treatment PET scan and response to treatment re-evaluation PET scan following cycle #3.  Both imaging tests demonstrate an abnormality in sigmoid colon that is hypermetabolic.  This area is actually more hypermetabolic following 3 cycles of chemotherapy. She notes that she has not seen a GI physician, nor has this been evaluated.  After long discussion and some persuasion, she is agreeable to referral to GI for consultation and recommendations regarding this area.  She is hesitant to undergo colonoscopy.  Possibly a sigmoidoscopy is all that is needed, but will defer to GI.  She would like to be referred to Dr. Gala Romney as a family member sees him.    She is not up-to-date on mammograms and refuses to have any moving forward.  We will need to continue to discuss this issue moving forward.    Labs today: CBC diff, CMET, LDH, ESR, CRP.  I personally reviewed and went over laboratory results with the patient.  The  results are noted within this dictation.  Port flush today.  Port flush every 6 weeks.  Labs in 3 months: CBC diff, CMET, LDH, ESR, CRP.  Return in 3 months for follow-up.   ORDERS PLACED FOR THIS ENCOUNTER: Orders Placed This Encounter  Procedures  . CBC with Differential  . Comprehensive metabolic panel  . Lactate dehydrogenase  . Sedimentation rate  . C-reactive protein  . CBC with Differential  . Comprehensive metabolic panel  . Lactate dehydrogenase  . Sedimentation rate  . C-reactive protein    MEDICATIONS PRESCRIBED THIS ENCOUNTER: Meds ordered this encounter  Medications  . aspirin 81 MG EC tablet    Sig: Take by mouth.  Marland Kitchen LORazepam (ATIVAN) 1 MG tablet    Sig: Take 1 pill by mouth or sub lingual every 4 hours as needed for nausea  . DISCONTD: allopurinol (ZYLOPRIM) 300 MG tablet    Sig: Take by mouth.  . DISCONTD: amLODipine (NORVASC) 5 MG tablet    Sig: Take by mouth.  . DISCONTD: clopidogrel (PLAVIX) 75 MG tablet    Sig: Take by mouth.  . DISCONTD: levothyroxine (SYNTHROID, LEVOTHROID) 88 MCG tablet    Sig: Take by mouth.  . DISCONTD: metoprolol (LOPRESSOR) 50 MG tablet    Sig: Take by mouth.  . DISCONTD: simvastatin (ZOCOR) 20 MG tablet    Sig: Take by mouth.  . DISCONTD: cephALEXin (KEFLEX) 500 MG capsule  . polyethylene glycol powder (GLYCOLAX/MIRALAX) powder  . heparin lock flush 100 unit/mL  . sodium chloride flush (NS) 0.9 % injection 10 mL    All questions were answered. The patient knows to call the clinic with any problems, questions or  concerns. We can certainly see the patient much sooner if necessary.  Patient discussed with Dr. Whitney Muse and together we ascertained an up-to-date interval history, and examined the patient.  Dr. Whitney Muse developed the patient's assessment and plan.  This was a shared visit-consultation.  Her attestation will follow below.  This note is electronically signed by: Robynn Pane, PA-C 07/13/2016 7:01 PM

## 2016-07-13 NOTE — Assessment & Plan Note (Signed)
Stage IIB diffuse large B-cell lymphoma treated by Dr. Tressie Stalker with R-CHOP 4/6 cycles due to recurrent hospitalizations (two) for febrile neutropenia with last treatment being in October 2016.  She then underwent XRT by Dr. Isidore Moos (09/11/2015- 10/30/2015).  Oncology history developed.  Staging in [facility] problem list completed.  I personally reviewed and went over laboratory results with the patient.  The results are noted within this dictation.  I personally reviewed and went over radiographic studies with the patient.  The results are noted within this dictation.  I have reviewed the patient's pre-treatment PET scan and response to treatment re-evaluation PET scan following cycle #3.  Both imaging tests demonstrate an abnormality in sigmoid colon that is hypermetabolic.  This area is actually more hypermetabolic following 3 cycles of chemotherapy. She notes that she has not seen a GI physician, nor has this been evaluated.  After long discussion and some persuasion, she is agreeable to referral to GI for consultation and recommendations regarding this area.  She is hesitant to undergo colonoscopy.  Possibly a sigmoidoscopy is all that is needed, but will defer to GI.  She would like to be referred to Dr. Gala Romney as a family member sees him.    She is not up-to-date on mammograms and refuses to have any moving forward.  We will need to continue to discuss this issue moving forward.    Labs today: CBC diff, CMET, LDH, ESR, CRP.  I personally reviewed and went over laboratory results with the patient.  The results are noted within this dictation.  Port flush today.  Port flush every 6 weeks.  Labs in 3 months: CBC diff, CMET, LDH, ESR, CRP.  Return in 3 months for follow-up.

## 2016-08-17 DIAGNOSIS — E039 Hypothyroidism, unspecified: Secondary | ICD-10-CM | POA: Diagnosis not present

## 2016-08-17 DIAGNOSIS — Z923 Personal history of irradiation: Secondary | ICD-10-CM | POA: Diagnosis not present

## 2016-08-17 DIAGNOSIS — E7800 Pure hypercholesterolemia, unspecified: Secondary | ICD-10-CM | POA: Diagnosis not present

## 2016-08-17 DIAGNOSIS — Z8589 Personal history of malignant neoplasm of other organs and systems: Secondary | ICD-10-CM | POA: Diagnosis not present

## 2016-08-17 DIAGNOSIS — Z7982 Long term (current) use of aspirin: Secondary | ICD-10-CM | POA: Diagnosis not present

## 2016-08-17 DIAGNOSIS — Z79899 Other long term (current) drug therapy: Secondary | ICD-10-CM | POA: Diagnosis not present

## 2016-08-17 DIAGNOSIS — M810 Age-related osteoporosis without current pathological fracture: Secondary | ICD-10-CM | POA: Diagnosis not present

## 2016-08-17 DIAGNOSIS — Z780 Asymptomatic menopausal state: Secondary | ICD-10-CM | POA: Diagnosis not present

## 2016-08-17 DIAGNOSIS — S92902A Unspecified fracture of left foot, initial encounter for closed fracture: Secondary | ICD-10-CM | POA: Diagnosis not present

## 2016-08-17 DIAGNOSIS — I10 Essential (primary) hypertension: Secondary | ICD-10-CM | POA: Diagnosis not present

## 2016-08-17 DIAGNOSIS — Z9221 Personal history of antineoplastic chemotherapy: Secondary | ICD-10-CM | POA: Diagnosis not present

## 2016-08-17 DIAGNOSIS — M109 Gout, unspecified: Secondary | ICD-10-CM | POA: Diagnosis not present

## 2016-08-24 ENCOUNTER — Encounter (HOSPITAL_COMMUNITY): Payer: Medicare Other | Attending: Oncology

## 2016-08-24 VITALS — BP 150/57 | HR 69 | Temp 98.6°F | Resp 18

## 2016-08-24 DIAGNOSIS — Z452 Encounter for adjustment and management of vascular access device: Secondary | ICD-10-CM | POA: Diagnosis not present

## 2016-08-24 DIAGNOSIS — C8330 Diffuse large B-cell lymphoma, unspecified site: Secondary | ICD-10-CM | POA: Insufficient documentation

## 2016-08-24 DIAGNOSIS — Z95828 Presence of other vascular implants and grafts: Secondary | ICD-10-CM

## 2016-08-24 MED ORDER — SODIUM CHLORIDE 0.9% FLUSH
10.0000 mL | Freq: Once | INTRAVENOUS | Status: AC
  Administered 2016-08-24: 10 mL via INTRAVENOUS

## 2016-08-24 MED ORDER — HEPARIN SOD (PORK) LOCK FLUSH 100 UNIT/ML IV SOLN
500.0000 [IU] | Freq: Once | INTRAVENOUS | Status: AC
  Administered 2016-08-24: 500 [IU] via INTRAVENOUS
  Filled 2016-08-24: qty 5

## 2016-08-24 NOTE — Progress Notes (Signed)
Cristina Gregory presented for Portacath access and flush. Proper placement of portacath confirmed by CXR. Portacath located left chest wall accessed with  H 20 needle. Good blood return present. Portacath flushed with 58ml NS and 500U/72ml Heparin and needle removed intact. Procedure without incident. Patient tolerated procedure well.

## 2016-08-24 NOTE — Patient Instructions (Signed)
[place] at [facility] Discharge Instructions  RECOMMENDATIONS MADE BY THE CONSULTANT AND ANY TEST RESULTS WILL BE SENT TO YOUR REFERRING PHYSICIAN.  Port flush today. Lab work with port flush due 10/05/16. Return as scheduled.  Thank you for choosing [place] at [facility] to provide your oncology and hematology care.  To afford each patient quality time with our provider, please arrive at least 15 minutes before your scheduled appointment time.   Beginning January 23rd 2017 lab work for the [facility] will be done in the  Main lab at [facility] on 1st floor. If you have a lab appointment with the [facility] please come in thru the  Main Entrance and check in at the main information desk  You need to re-schedule your appointment should you arrive 10 or more minutes late.  We strive to give you quality time with our providers, and arriving late affects you and other patients whose appointments are after yours.  Also, if you no show three or more times for appointments you may be dismissed from the clinic at the providers discretion.     Again, thank you for choosing [facility].  Our hope is that these requests will decrease the amount of time that you wait before being seen by our physicians.       _____________________________________________________________  Should you have questions after your visit to [facility], please contact our office at [phone] between the hours of 8:30 a.m. and 4:30 p.m.  Voicemails left after 4:30 p.m. will not be returned until the following business day.  For prescription refill requests, have your pharmacy contact our office.         Resources For Cancer Patients and their Caregivers ? American Cancer Society: Can assist with transportation, wigs, general needs, runs Look Good Feel Better.        832-552-6392 ? Cancer Care: Provides financial assistance, online  support groups, medication/co-pay assistance.  1-800-813-HOPE 843-876-2490) ? [facility] [place] Assists [place] cancer patients and their families through emotional , educational and financial support.  (684)462-5116 ? [place] DSS Where to apply for food stamps, Medicaid and utility assistance. (713) 520-3532 ? RCATS: Transportation to medical appointments. (865)811-7042 ? Social Security Administration: May apply for disability if have a Stage IV cancer. 727-455-8485 (217)243-0997 ? [employer], Disability and Transit Services: Assists with nutrition, care and transit needs. [place] Support Programs: @10RELATIVEDAYS @ > Cancer Support Group  2nd Tuesday of the month 1pm-2pm, Journey Room  > Creative Journey  3rd Tuesday of the month 1130am-1pm, Journey Room  > Look Good Feel Better  1st Wednesday of the month 10am-12 noon, Journey Room (Call [place] to register [phone])

## 2016-08-30 DIAGNOSIS — E1129 Type 2 diabetes mellitus with other diabetic kidney complication: Secondary | ICD-10-CM | POA: Diagnosis not present

## 2016-08-30 DIAGNOSIS — E039 Hypothyroidism, unspecified: Secondary | ICD-10-CM | POA: Diagnosis not present

## 2016-08-30 DIAGNOSIS — K5901 Slow transit constipation: Secondary | ICD-10-CM | POA: Diagnosis not present

## 2016-08-30 DIAGNOSIS — E782 Mixed hyperlipidemia: Secondary | ICD-10-CM | POA: Diagnosis not present

## 2016-08-30 DIAGNOSIS — I10 Essential (primary) hypertension: Secondary | ICD-10-CM | POA: Diagnosis not present

## 2016-10-05 ENCOUNTER — Encounter (HOSPITAL_COMMUNITY): Payer: Medicare Other | Attending: Oncology

## 2016-10-05 ENCOUNTER — Encounter (HOSPITAL_COMMUNITY): Payer: Self-pay

## 2016-10-05 DIAGNOSIS — Z95828 Presence of other vascular implants and grafts: Secondary | ICD-10-CM | POA: Diagnosis not present

## 2016-10-05 DIAGNOSIS — C8330 Diffuse large B-cell lymphoma, unspecified site: Secondary | ICD-10-CM | POA: Diagnosis not present

## 2016-10-05 LAB — COMPREHENSIVE METABOLIC PANEL
ALBUMIN: 4.3 g/dL (ref 3.5–5.0)
ALT: 26 U/L (ref 14–54)
AST: 23 U/L (ref 15–41)
Alkaline Phosphatase: 100 U/L (ref 38–126)
Anion gap: 10 (ref 5–15)
BUN: 32 mg/dL — AB (ref 6–20)
CHLORIDE: 102 mmol/L (ref 101–111)
CO2: 27 mmol/L (ref 22–32)
CREATININE: 1.45 mg/dL — AB (ref 0.44–1.00)
Calcium: 10.4 mg/dL — ABNORMAL HIGH (ref 8.9–10.3)
GFR calc Af Amer: 39 mL/min — ABNORMAL LOW (ref >60)
GFR calc non Af Amer: 33 mL/min — ABNORMAL LOW (ref >60)
Glucose, Bld: 149 mg/dL — ABNORMAL HIGH (ref 65–99)
POTASSIUM: 4.3 mmol/L (ref 3.5–5.1)
Sodium: 139 mmol/L (ref 135–145)
Total Bilirubin: 0.5 mg/dL (ref 0.3–1.2)
Total Protein: 7.2 g/dL (ref 6.5–8.1)

## 2016-10-05 LAB — SEDIMENTATION RATE: Sed Rate: 25 mm/hr — ABNORMAL HIGH (ref 0–22)

## 2016-10-05 LAB — CBC WITH DIFFERENTIAL/PLATELET
BASOS ABS: 0 10*3/uL (ref 0.0–0.1)
BASOS PCT: 0 %
EOS PCT: 2 %
Eosinophils Absolute: 0.1 10*3/uL (ref 0.0–0.7)
HCT: 39.4 % (ref 36.0–46.0)
Hemoglobin: 12.9 g/dL (ref 12.0–15.0)
Lymphocytes Relative: 25 %
Lymphs Abs: 1.6 10*3/uL (ref 0.7–4.0)
MCH: 30.5 pg (ref 26.0–34.0)
MCHC: 32.7 g/dL (ref 30.0–36.0)
MCV: 93.1 fL (ref 78.0–100.0)
MONO ABS: 0.6 10*3/uL (ref 0.1–1.0)
Monocytes Relative: 10 %
NEUTROS ABS: 3.9 10*3/uL (ref 1.7–7.7)
Neutrophils Relative %: 63 %
PLATELETS: 147 10*3/uL — AB (ref 150–400)
RBC: 4.23 MIL/uL (ref 3.87–5.11)
RDW: 14.9 % (ref 11.5–15.5)
WBC: 6.3 10*3/uL (ref 4.0–10.5)

## 2016-10-05 LAB — LACTATE DEHYDROGENASE: LDH: 109 U/L (ref 98–192)

## 2016-10-05 LAB — C-REACTIVE PROTEIN: CRP: <0.8 mg/dL (ref <1.0)

## 2016-10-05 MED ORDER — SODIUM CHLORIDE 0.9% FLUSH
20.0000 mL | INTRAVENOUS | Status: DC | PRN
  Administered 2016-10-05: 20 mL via INTRAVENOUS
  Filled 2016-10-05: qty 20

## 2016-10-05 MED ORDER — HEPARIN SOD (PORK) LOCK FLUSH 100 UNIT/ML IV SOLN
500.0000 [IU] | Freq: Once | INTRAVENOUS | Status: AC
  Administered 2016-10-05: 500 [IU] via INTRAVENOUS
  Filled 2016-10-05: qty 5

## 2016-10-05 NOTE — Patient Instructions (Signed)
[facility] at [facility] Discharge Instructions  RECOMMENDATIONS MADE BY THE CONSULTANT AND ANY TEST RESULTS WILL BE SENT TO YOUR REFERRING PHYSICIAN.  Port flush with labs.    Thank you for choosing [facility] at [facility] to provide your oncology and hematology care.  To afford each patient quality time with our provider, please arrive at least 15 minutes before your scheduled appointment time.   Beginning January 23rd 2017 lab work for the Cancer Center will be done in the  Main lab at [facility] on 1st floor. If you have a lab appointment with the Cancer Center please come in thru the  Main Entrance and check in at the main information desk  You need to re-schedule your appointment should you arrive 10 or more minutes late.  We strive to give you quality time with our providers, and arriving late affects you and other patients whose appointments are after yours.  Also, if you no show three or more times for appointments you may be dismissed from the clinic at the providers discretion.     Again, thank you for choosing [facility].  Our hope is that these requests will decrease the amount of time that you wait before being seen by our physicians.       _____________________________________________________________  Should you have questions after your visit to [facility], please contact our office at [phone] between the hours of 8:30 a.m. and 4:30 p.m.  Voicemails left after 4:30 p.m. will not be returned until the following business day.  For prescription refill requests, have your pharmacy contact our office.         Resources For Cancer Patients and their Caregivers ? American Cancer Society: Can assist with transportation, wigs, general needs, runs Look Good Feel Better.        1-888-227-6333 ? Cancer Care: Provides financial assistance, online support groups, medication/co-pay assistance.   1-800-813-HOPE (4673) ? [facility] Assists [place] cancer patients and their families through emotional , educational and financial support.  336-427-4357 ? [place] DSS Where to apply for food stamps, Medicaid and utility assistance. 336-342-1394 ? RCATS: Transportation to medical appointments. 336-347-2287 ? Social Security Administration: May apply for disability if have a Stage IV cancer. 336-342-7796 1-800-772-1213 ? [place] Aging, Disability and Transit Services: Assists with nutrition, care and transit needs. 336-349-2343  Cancer Center Support Programs: @10RELATIVEDAYS@ > Cancer Support Group  2nd Tuesday of the month 1pm-2pm, Journey Room  > Creative Journey  3rd Tuesday of the month 1130am-1pm, Journey Room  > Look Good Feel Better  1st Wednesday of the month 10am-12 noon, Journey Room (Call American Cancer Society to register [phone])    

## 2016-10-05 NOTE — Progress Notes (Signed)
Cristina Gregory presented for Portacath access and flush. Proper placement of portacath confirmed by CXR. Portacath located left chest wall accessed with  H 20 needle. Good blood return present.  Specimen drawn for labs.   Portacath flushed with 69ml NS and 500U/45ml Heparin and needle removed intact. Procedure without incident. Patient tolerated procedure well.

## 2016-10-12 ENCOUNTER — Ambulatory Visit (HOSPITAL_COMMUNITY): Payer: Medicare Other | Admitting: Oncology

## 2016-11-08 DIAGNOSIS — E039 Hypothyroidism, unspecified: Secondary | ICD-10-CM | POA: Diagnosis not present

## 2016-11-08 DIAGNOSIS — E875 Hyperkalemia: Secondary | ICD-10-CM | POA: Diagnosis not present

## 2016-11-08 DIAGNOSIS — E1129 Type 2 diabetes mellitus with other diabetic kidney complication: Secondary | ICD-10-CM | POA: Diagnosis not present

## 2016-11-08 DIAGNOSIS — E782 Mixed hyperlipidemia: Secondary | ICD-10-CM | POA: Diagnosis not present

## 2016-11-08 DIAGNOSIS — E7800 Pure hypercholesterolemia, unspecified: Secondary | ICD-10-CM | POA: Diagnosis not present

## 2016-11-09 ENCOUNTER — Ambulatory Visit (HOSPITAL_COMMUNITY): Payer: Medicare Other | Admitting: Oncology

## 2016-11-11 ENCOUNTER — Ambulatory Visit: Payer: Medicare Other | Admitting: Psychology

## 2016-11-14 DIAGNOSIS — K5901 Slow transit constipation: Secondary | ICD-10-CM | POA: Diagnosis not present

## 2016-11-14 DIAGNOSIS — I10 Essential (primary) hypertension: Secondary | ICD-10-CM | POA: Diagnosis not present

## 2016-11-14 DIAGNOSIS — E782 Mixed hyperlipidemia: Secondary | ICD-10-CM | POA: Diagnosis not present

## 2016-11-14 DIAGNOSIS — E039 Hypothyroidism, unspecified: Secondary | ICD-10-CM | POA: Diagnosis not present

## 2016-11-14 DIAGNOSIS — E1129 Type 2 diabetes mellitus with other diabetic kidney complication: Secondary | ICD-10-CM | POA: Diagnosis not present

## 2016-11-30 ENCOUNTER — Encounter (HOSPITAL_COMMUNITY): Payer: Medicare Other

## 2016-12-06 ENCOUNTER — Encounter (HOSPITAL_COMMUNITY): Payer: Self-pay | Admitting: Adult Health

## 2016-12-06 ENCOUNTER — Ambulatory Visit (HOSPITAL_COMMUNITY): Payer: Medicare Other | Admitting: Oncology

## 2016-12-06 ENCOUNTER — Encounter (HOSPITAL_COMMUNITY): Payer: Medicare Other | Attending: Oncology

## 2016-12-06 ENCOUNTER — Encounter (HOSPITAL_BASED_OUTPATIENT_CLINIC_OR_DEPARTMENT_OTHER): Payer: Medicare Other | Admitting: Adult Health

## 2016-12-06 VITALS — BP 150/92 | HR 62 | Temp 98.5°F | Resp 18 | Wt 132.9 lb

## 2016-12-06 DIAGNOSIS — Z95828 Presence of other vascular implants and grafts: Secondary | ICD-10-CM | POA: Insufficient documentation

## 2016-12-06 DIAGNOSIS — F4321 Adjustment disorder with depressed mood: Secondary | ICD-10-CM

## 2016-12-06 DIAGNOSIS — C8330 Diffuse large B-cell lymphoma, unspecified site: Secondary | ICD-10-CM | POA: Diagnosis not present

## 2016-12-06 LAB — LACTATE DEHYDROGENASE: LDH: 123 U/L (ref 98–192)

## 2016-12-06 MED ORDER — HEPARIN SOD (PORK) LOCK FLUSH 100 UNIT/ML IV SOLN
500.0000 [IU] | Freq: Once | INTRAVENOUS | Status: AC
  Administered 2016-12-06: 500 [IU] via INTRAVENOUS

## 2016-12-06 MED ORDER — SODIUM CHLORIDE 0.9% FLUSH
10.0000 mL | Freq: Once | INTRAVENOUS | Status: AC
Start: 2016-12-06 — End: 2016-12-06
  Administered 2016-12-06: 10 mL via INTRAVENOUS

## 2016-12-06 MED ORDER — HEPARIN SOD (PORK) LOCK FLUSH 100 UNIT/ML IV SOLN
INTRAVENOUS | Status: AC
  Filled 2016-12-06: qty 5

## 2016-12-06 NOTE — Progress Notes (Addendum)
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  Diagnosis via right supraclavicular/cervical lymph node biopsy.      05/26/2015 Cancer Staging    Stage IIB      05/26/2015 PET scan    Hypermetabolic right supraclavicular adenopathy. Mild hypermetabolic AP window lymph node. A focal hypermetabolic wall thickening in the mid sigmoid colon, correlate for acute diverticulitis versus mass. No hypermetabolic right lung base nodule, likely too small for evaluation by PET      05/28/2015 Echocardiogram    Left ventricle is normal in size. Mild concentric left ventricular hypertrophy. Left ventricle ejection fraction is normal (60-65 percent). Left ventricular diastolic function is abnormal. There is mild aortic regurgitation.      06/04/2015 -  Chemotherapy    RCHOP, administered by Dr. Tressie Stalker at [facility].  Dose reduction beginning on cycle two.  Due to complications, received 4 cycles of chemotherapy. Her last 2 cycles cancelled.      07/06/2015 Adverse Reaction    Hospitalized with neutropenic fever but negative blood cultures      08/03/2015 Remission    PET scan shows complete remission after 3 cycles of chemotherapy      08/03/2015 PET scan    A near complete resolution of right supraclavicular nodes. No hypermetabolic activity remaining. Stable activity within the left thyroid lobe. Ultrasound for further evaluation if clinically indicated. Stable 3 mm right basilar nodule. Nonspecific activity in the sigmoid colon is increased.      08/20/2015 Adverse  Reaction    Hospitalized after cycle 4 with neutropenic fever, thrombocytopenia, and Staphylococcus epidermidis blood culture positive 1      09/11/2015 - 10/30/2015 Radiation Therapy    36 Gy delivered to areas of previous involvement due to inability to tolerate further chemotherapy.        HISTORY OF PRESENT ILLNESS:  (From Kirby Crigler, PA-C's last note on 07/13/16)     INTERVAL HISTORY:  Ms. Crumbaugh returns for follow-up of her history of diffuse large B-cell lymphoma.  She tells me she has been feeling quite well. Denies any fever, chills, night sweats, or unintentional weight loss. Her appetite is good; energy levels are fair. She periodically feels weak and tired. She denies any new mass or adenopathy.  She tells me that she was referred to GI for consideration for EGD/colonoscopy at her last visit to the cancer center, but she never received a call from the GI office. This is in reference to a nonspecific hypermetabolic activity to the sigmoid colon noticed on previous PET scan.  She endorses occasional abdominal pain; last episode was noted in 09/2016 where she experienced left lower quadrant abdominal pain. This was treated with MiraLAX by her PCP, and the pain resolved.  She endorses feeling a bit depressed lately; she really would like to return to her home in [place] and live independently. Her son does not feel that she is capable of living alone anymore, and insists that she live with him. She does not drive, but otherwise is completely independent in her activities of daily living.  REVIEW OF SYSTEMS:  Review of Systems  Constitutional: Positive for fatigue. Negative for appetite change, chills, fever and unexpected weight change.  HENT:  Negative.  Negative for lump/mass and sore throat.   Eyes: Negative.   Respiratory: Negative.  Negative for cough and shortness of breath.   Cardiovascular: Negative.  Negative for chest pain and palpitations.  Gastrointestinal:  Negative.  Negative for abdominal pain, blood in stool, constipation, diarrhea, nausea and vomiting.  Endocrine: Negative.   Genitourinary: Negative.  Negative for dysuria and hematuria.   Musculoskeletal: Negative.   Skin: Negative.  Negative for rash.  Neurological: Negative.  Negative for dizziness and headaches.  Hematological: Negative.  Negative for adenopathy. Does not bruise/bleed easily.  Psychiatric/Behavioral: Positive for depression.     PAST MEDICAL/SURGICAL HISTORY:  Past Medical History:  Diagnosis Date  . DLBCL (diffuse large B cell lymphoma) ([place]) 07/13/2016   Past Surgical History:  Procedure Laterality Date  . SLT LASER APPLICATION Right [id number]   Procedure: SLT LASER APPLICATION;  Surgeon: Williams Che, MD;  Location: [facility];  Service: Ophthalmology;  Laterality: Right;     SOCIAL HISTORY:  Social History   Social History  . Marital status: Single    Spouse name: N/A  . Number of children: N/A  . Years of education: N/A   Occupational History  . Not on file.   Social History Main Topics  . Smoking status: Never Smoker  . Smokeless tobacco: Never Used  . Alcohol use Not on file  . Drug use: Unknown  . Sexual activity: Not on file   Other Topics Concern  . Not on file   Social History Narrative  . No narrative on file    FAMILY HISTORY:  No family history on file.  CURRENT MEDICATIONS:  Outpatient Encounter Prescriptions as of 12/06/2016  Medication Sig Note  . allopurinol (ZYLOPRIM) 300 MG tablet Take 300 mg by mouth daily.   Marland Kitchen amLODipine (NORVASC) 5 MG tablet Take 5 mg by mouth daily.   Marland Kitchen aspirin 81 MG EC tablet Take by mouth. 07/13/2016: Received from: [place]: Take 81 mg by mouth daily.  . clopidogrel (PLAVIX) 75 MG tablet Take 75 mg by mouth daily.   Marland Kitchen levothyroxine (SYNTHROID, LEVOTHROID) 88 MCG tablet Take 88 mcg by mouth daily before breakfast.   . metoprolol (LOPRESSOR) 50 MG tablet Take 25 mg by mouth 2  (two) times daily.   . simvastatin (ZOCOR) 20 MG tablet Take 20 mg by mouth daily.   Marland Kitchen LORazepam (ATIVAN) 1 MG tablet Take 1 pill by mouth or sub lingual every 4 hours as needed for nausea 07/13/2016: Received from: [place]  . polyethylene glycol powder (GLYCOLAX/MIRALAX) powder  07/13/2016: Received from: External Pharmacy  . Vitamin D, Ergocalciferol, (DRISDOL) 50000 UNITS CAPS capsule Take 50,000 Units by mouth every 7 (seven) days. Take on Wednesday    Facility-Administered Encounter Medications as of 12/06/2016  Medication  . [COMPLETED] heparin lock flush 100 unit/mL  . sodium chloride flush (NS) 0.9 % injection 10 mL  . [COMPLETED] sodium chloride flush (NS) 0.9 % injection 10 mL    ALLERGIES:  No Known Allergies   PHYSICAL EXAM:  ECOG Performance status: 0 - Independent   Vitals:   12/06/16 1200  BP: (!) 150/92  Pulse: 62  Resp: 18  Temp: 98.5 F (36.9 C)   Filed Weights   12/06/16 1200  Weight: 132 lb 14.4 oz (60.3 kg)    Physical Exam  Constitutional:  She is oriented to person, place, and time and well-developed, well-nourished, and in no distress.  HENT:  Head: Normocephalic.  Mouth/Throat: Oropharynx is clear and moist. No oropharyngeal exudate.  Eyes: Conjunctivae are normal. Pupils are equal, round, and reactive to light. No scleral icterus.  Neck: Normal range of motion. Neck supple.  Cardiovascular: Normal rate, regular rhythm and normal heart sounds.   Pulmonary/Chest: Effort normal and breath sounds normal. No respiratory distress. She has no wheezes.  Abdominal: Soft. Bowel sounds are normal. There is no tenderness.  Musculoskeletal: Normal range of motion. She exhibits no edema.  Lymphadenopathy:    She has no cervical adenopathy.    She has no axillary adenopathy.       Right: No supraclavicular adenopathy present.       Left: No supraclavicular adenopathy present.  Neurological: She is alert and oriented to person, place, and time. No cranial  nerve deficit. Gait normal.  Skin: Skin is warm and dry. No rash noted.  Psychiatric: Mood, memory, affect and judgment normal.     LABORATORY DATA:  I have reviewed the labs as listed.  CBC    Component Value Date/Time   WBC 6.3 10/05/2016 0952   RBC 4.23 10/05/2016 0952   HGB 12.9 10/05/2016 0952   HCT 39.4 10/05/2016 0952   PLT 147 (L) 10/05/2016 0952   MCV 93.1 10/05/2016 0952   MCH 30.5 10/05/2016 0952   MCHC 32.7 10/05/2016 0952   RDW 14.9 10/05/2016 0952   LYMPHSABS 1.6 10/05/2016 0952   MONOABS 0.6 10/05/2016 0952   EOSABS 0.1 10/05/2016 0952   BASOSABS 0.0 10/05/2016 0952   CMP Latest Ref Rng & Units 10/05/2016 07/13/2016  Glucose 65 - 99 mg/dL 149(H) 105(H)  BUN 6 - 20 mg/dL 32(H) 30(H)  Creatinine 0.44 - 1.00 mg/dL 1.45(H) 1.32(H)  Sodium 135 - 145 mmol/L 139 136  Potassium 3.5 - 5.1 mmol/L 4.3 3.9  Chloride 101 - 111 mmol/L 102 103  CO2 22 - 32 mmol/L 27 27  Calcium 8.9 - 10.3 mg/dL 10.4(H) 9.1  Total Protein 6.5 - 8.1 g/dL 7.2 7.6  Total Bilirubin 0.3 - 1.2 mg/dL 0.5 0.5  Alkaline Phos 38 - 126 U/L 100 132(H)  AST 15 - 41 U/L 23 23  ALT 14 - 54 U/L 26 26   Lab results from PCP office collected 11/08/16       Results for MONTRELL, TOOMES (MRN [id number])   Ref. Range 12/06/2016 12:07  LDH Latest Ref Range: 98 - 192 U/L 123    PENDING LABS:    DIAGNOSTIC IMAGING:  CT abd/pelvis (done at [facility]): 05/11/16      PATHOLOGY:     ASSESSMENT & PLAN:   History of Diffuse Large B-Cell Lymphoma:  -Clinically, she is doing well. No fever, chills, night sweats, or unintended weight loss. CBC with diff and CMET collected at her PCP's office is largely normal and stable.  LDH normal.  No lymphadenopathy on exam.  I reviewed NCCN Guidelines with her regarding follow-up. We discussed that routine imaging is not recommended; we would certainly order diagnostic imaging, when clinically warranted. We will bring her back in 6 months for history,  physical exam, and lab studies.  She knows we can see her sooner, if needed.      Non-specific hypermetabolic activity to sigmoid colon on PET scan:  -Referral to GI for EGD/colonoscopy placed previously; patient did not receive call for this appointment. We will re-refer to GI for evaluation.  She reports that she previously had left lower quadrant pain in 09/2016; this was treated with MiraLAX by her PCP and her pain resolved.  Situational depression:  -She endorses feeling a bit depressed secondary to being able to live alone; she has been living with her son for about 2 years, since her cancer diagnosis.  This has been frustrating for her.  She reports that she does not drive, but otherwise is very independent in her daily activities.  From a cancer standpoint, it would not be unreasonable for her to return to independent living. Of course, she and her family have to come to an agreement on what may be best for her going forward. Encouraged her to share her concerns with her son.    Health maintenance/Wellness promotion:  -It has been several years since she has had a mammogram; she declines orders for her mammogram at this visit. I reviewed national guidelines with her, which recommend annual mammography for screening for breast cancer. She again declines stating, "I just don't need one." She is reportedly up-to-date on her vaccinations. She does not smoke or drink alcohol. Encouraged her to participate in exercise regularly, not only for her overall health, but also for her bone health after menopause.  Encouraged her to maintain adequate follow-up with her PCP and other specialists.     Dispo:  -Referral to GI for evaluation.  -Return to cancer center in 6 months for continued surveillance.    All questions were answered to patient's stated satisfaction. Encouraged patient to call with any new concerns or questions before her next visit to the cancer center and we can certain see her  sooner, if needed.      Mike Craze, NP [place] [phone]

## 2016-12-06 NOTE — Patient Instructions (Addendum)
[place] at [facility] Discharge Instructions  RECOMMENDATIONS MADE BY THE CONSULTANT AND ANY TEST RESULTS WILL BE SENT TO YOUR REFERRING PHYSICIAN.  Exam with Mike Craze, NP. Return to the clinic in 6 months with labs Refer to GI for consideration of colonoscopy due to a PET scan finding   Thank you for choosing [place] at [facility] to provide your oncology and hematology care.  To afford each patient quality time with our provider, please arrive at least 15 minutes before your scheduled appointment time.    If you have a lab appointment with the [facility] please come in thru the  Main Entrance and check in at the main information desk  You need to re-schedule your appointment should you arrive 10 or more minutes late.  We strive to give you quality time with our providers, and arriving late affects you and other patients whose appointments are after yours.  Also, if you no show three or more times for appointments you may be dismissed from the clinic at the providers discretion.     Again, thank you for choosing [facility].  Our hope is that these requests will decrease the amount of time that you wait before being seen by our physicians.       _____________________________________________________________  Should you have questions after your visit to [facility], please contact our office at [phone] between the hours of 8:30 a.m. and 4:30 p.m.  Voicemails left after 4:30 p.m. will not be returned until the following business day.  For prescription refill requests, have your pharmacy contact our office.       Resources For Cancer Patients and their Caregivers ? American Cancer Society: Can assist with transportation, wigs, general needs, runs Look Good Feel Better.        ([phone] ? Cancer Care: Provides financial assistance, online support groups, medication/co-pay assistance.   1-800-813-HOPE 8306094658) ? [place] Assists [place] cancer patients and their families through emotional , educational and financial support.  ([phone] ? [place] DSS Where to apply for food stamps, Medicaid and utility assistance. 413 771 2864 ? RCATS: Transportation to medical appointments. 623-266-9179 ? Social Security Administration: May apply for disability if have a Stage IV cancer. 509-021-8242 [phone]) [phone] ? [employer], Disability and Transit Services: Assists with nutrition, care and transit needs. [place] Support Programs: @10RELATIVEDAYS @ > Cancer Support Group  2nd Tuesday of the month 1pm-2pm, Journey Room  > Creative Journey  3rd Tuesday of the month 1130am-1pm, Journey Room  > Look Good Feel Better  1st Wednesday of the month 10am-12 noon, Journey Room (Call [place] to register [phone])

## 2016-12-06 NOTE — Progress Notes (Signed)
Cristina Gregory presented for Portacath access and flush. Proper placement of portacath confirmed by CXR. Portacath located left chest wall accessed with  H 20 needle. Good blood return present. Portacath flushed with 26ml NS and 500U/55ml Heparin and needle removed intact. Procedure without incident. Patient tolerated procedure well.

## 2016-12-06 NOTE — Pre-Procedure Instructions (Signed)
Labs from PCP-11/08/16

## 2016-12-06 NOTE — Pre-Procedure Instructions (Signed)
CT abd/pelvis [facility])

## 2016-12-07 ENCOUNTER — Encounter: Payer: Self-pay | Admitting: Internal Medicine

## 2016-12-23 ENCOUNTER — Ambulatory Visit: Payer: Medicare Other | Admitting: Gastroenterology

## 2017-01-11 ENCOUNTER — Ambulatory Visit: Payer: Medicare Other | Admitting: Gastroenterology

## 2017-01-31 ENCOUNTER — Encounter (HOSPITAL_COMMUNITY): Payer: Self-pay

## 2017-01-31 ENCOUNTER — Encounter (HOSPITAL_COMMUNITY): Payer: Medicare Other | Attending: Oncology

## 2017-01-31 VITALS — BP 155/56 | HR 64 | Temp 97.8°F | Resp 18

## 2017-01-31 DIAGNOSIS — C8330 Diffuse large B-cell lymphoma, unspecified site: Secondary | ICD-10-CM | POA: Diagnosis not present

## 2017-01-31 DIAGNOSIS — Z452 Encounter for adjustment and management of vascular access device: Secondary | ICD-10-CM

## 2017-01-31 DIAGNOSIS — Z95828 Presence of other vascular implants and grafts: Secondary | ICD-10-CM | POA: Insufficient documentation

## 2017-01-31 MED ORDER — HEPARIN SOD (PORK) LOCK FLUSH 100 UNIT/ML IV SOLN
500.0000 [IU] | Freq: Once | INTRAVENOUS | Status: AC
  Administered 2017-01-31: 500 [IU] via INTRAVENOUS

## 2017-01-31 MED ORDER — HEPARIN SOD (PORK) LOCK FLUSH 100 UNIT/ML IV SOLN
INTRAVENOUS | Status: AC
  Filled 2017-01-31: qty 5

## 2017-01-31 MED ORDER — EPOETIN ALFA 10000 UNIT/ML IJ SOLN
INTRAMUSCULAR | Status: AC
  Filled 2017-01-31: qty 1

## 2017-01-31 MED ORDER — SODIUM CHLORIDE 0.9% FLUSH
10.0000 mL | INTRAVENOUS | Status: DC | PRN
  Administered 2017-01-31: 10 mL via INTRAVENOUS
  Filled 2017-01-31: qty 10

## 2017-01-31 NOTE — Progress Notes (Signed)
Cristina Gregory tolerated port flush well without complaints incident. Port accessed with 20 gauge needle with good blood return noted then flushed with 10 ml NS and 5 ml Heparin easily per protocol. VSS Pt discharged self ambulatory in satisfactory condition

## 2017-01-31 NOTE — Patient Instructions (Signed)
[facility] Discharge Instructions  RECOMMENDATIONS MADE BY THE CONSULTANT AND ANY TEST RESULTS WILL BE SENT TO YOUR REFERRING PHYSICIAN.  Portacath flushed per protocol today. Follow-up as scheduled. Call clinic for any questions or concerns  Thank you for choosing [facility] at [facility] to provide your oncology and hematology care.  To afford each patient quality time with our provider, please arrive at least 15 minutes before your scheduled appointment time.    If you have a lab appointment with the Cancer Center please come in thru the  Main Entrance and check in at the main information desk  You need to re-schedule your appointment should you arrive 10 or more minutes late.  We strive to give you quality time with our providers, and arriving late affects you and other patients whose appointments are after yours.  Also, if you no show three or more times for appointments you may be dismissed from the clinic at the providers discretion.     Again, thank you for choosing [facility].  Our hope is that these requests will decrease the amount of time that you wait before being seen by our physicians.       _____________________________________________________________  Should you have questions after your visit to  Cancer Center, please contact our office at [phone] between the hours of 8:30 a.m. and 4:30 p.m.  Voicemails left after 4:30 p.m. will not be returned until the following business day.  For prescription refill requests, have your pharmacy contact our office.       Resources For Cancer Patients and their Caregivers ? American Cancer Society: Can assist with transportation, wigs, general needs, runs Look Good Feel Better.        1-888-227-6333 ? Cancer Care: Provides financial assistance, online support groups, medication/co-pay assistance.  1-800-813-HOPE (4673) ? [facility] Assists [place] cancer patients and their families through emotional , educational and financial support.  336-427-4357 ? [place] DSS Where to apply for food stamps, Medicaid and utility assistance. 336-342-1394 ? [facility]: Transportation to medical appointments. 336-347-2287 ? Social Security Administration: May apply for disability if have a Stage IV cancer. 336-342-7796 1-800-772-1213 ? [place] Aging, Disability and Transit Services: Assists with nutrition, care and transit needs. 336-349-2343  Cancer Center Support Programs: @10RELATIVEDAYS@ > Cancer Support Group  2nd Tuesday of the month 1pm-2pm, Journey Room  > Creative Journey  3rd Tuesday of the month 1130am-1pm, Journey Room  > Look Good Feel Better  1st Wednesday of the month 10am-12 noon, Journey Room (Call American Cancer Society to register 1-800-395-5775)   

## 2017-02-07 ENCOUNTER — Other Ambulatory Visit: Payer: Self-pay

## 2017-02-07 ENCOUNTER — Ambulatory Visit (INDEPENDENT_AMBULATORY_CARE_PROVIDER_SITE_OTHER): Payer: Medicare Other | Admitting: Gastroenterology

## 2017-02-07 ENCOUNTER — Encounter: Payer: Self-pay | Admitting: Gastroenterology

## 2017-02-07 DIAGNOSIS — R948 Abnormal results of function studies of other organs and systems: Secondary | ICD-10-CM

## 2017-02-07 MED ORDER — PEG 3350-KCL-NA BICARB-NACL 420 G PO SOLR: 4000.0000 mL | ORAL | 0 refills | Status: DC

## 2017-02-07 NOTE — Progress Notes (Signed)
Primary Care Physician:  Manon Hilding, MD  Primary Gastroenterologist:  Barney Drain, MD   Chief Complaint  Patient presents with  . Advice Only  . abnormal pet scan    HPI:  Cristina Gregory is a 82 y.o. female here at the request of [facility] for abnormal sigmoid colon on previous PET. Patient has a history of stage IIB diffuse large B-cell lymphoma treated with 4 cycles of R CHOP (06/04/2015-08/20/2015) with 2 hospitalizations requiring discontinuation of therapy early and foregoing final 2 cycles of therapy (goal of 6 cycles) followed by radiation therapy by Dr. Isidore Moos (09/11/2015-10/30/2015). Previously seen in [place] but transferred care back in September to [place]. PET scan in August 2016 (pretreatment) showed focal hypermetabolic wall thickening in the mid sigmoid colon. Reevaluation PET scan after cycle #3 chemotherapy actually showed more hypermetabolic activity in the sigmoid colon. Patient denies having further evaluation by GI after these findings.  Clinically she feels well from a GI standpoint. Bowel movements are regular. No blood in the stool or melena. Denies abdominal pain. Appetite is good. No unintentional weight loss. No prior colonoscopy to knowledge. She has mild constipation managed well with MiraLAX. No dysphagia or heartburn.  Current Outpatient Prescriptions  Medication Sig Dispense Refill  . allopurinol (ZYLOPRIM) 300 MG tablet Take 300 mg by mouth daily.    Marland Kitchen amLODipine (NORVASC) 5 MG tablet Take 5 mg by mouth daily.    Marland Kitchen aspirin 81 MG EC tablet Take by mouth.    . clopidogrel (PLAVIX) 75 MG tablet Take 75 mg by mouth daily.    Marland Kitchen levothyroxine (SYNTHROID, LEVOTHROID) 88 MCG tablet Take 88 mcg by mouth daily before breakfast.    . metoprolol (LOPRESSOR) 50 MG tablet Take 25 mg by mouth 2 (two) times daily.    . polyethylene glycol powder (GLYCOLAX/MIRALAX) powder     . simvastatin (ZOCOR) 20 MG tablet Take 20 mg by mouth daily.     . Vitamin D, Ergocalciferol, (DRISDOL) 50000 UNITS CAPS capsule Take 50,000 Units by mouth every 7 (seven) days. Take on Wednesday     No current facility-administered medications for this visit.    Facility-Administered Medications Ordered in Other Visits  Medication Dose Route Frequency Provider Last Rate Last Dose  . sodium chloride flush (NS) 0.9 % injection 10 mL  10 mL Intravenous PRN [facility], PA-C   10 mL at 07/13/16 1304    Allergies as of 02/07/2017  . (No Known Allergies)    Past Medical History:  Diagnosis Date  . Chronic renal insufficiency   . DLBCL (diffuse large B cell lymphoma) ([place]) 07/13/2016  . Gout   . Hypertension   . Hypothyroidism     Past Surgical History:  Procedure Laterality Date  . Port-A-Cath placement    . SLT LASER APPLICATION Right 2/35/5732   Procedure: SLT LASER APPLICATION;  Surgeon: Williams Che, MD;  Location: [facility];  Service: Ophthalmology;  Laterality: Right;    Family History  Problem Relation Age of Onset  . Colon cancer Mother     Patient states her mother has been: Issues before it could be evaluated she died from heart disease.    Social History   Social History  . Marital status: Single    Spouse name: N/A  . Number of children: 4  . Years of education: N/A   Occupational History  . Not on file.   Social History Main Topics  . Smoking status: Never  Smoker  . Smokeless tobacco: Never Used  . Alcohol use No  . Drug use: No  . Sexual activity: Not on file   Other Topics Concern  . Not on file   Social History Narrative  . No narrative on file      ROS:  General: Negative for anorexia, weight loss, fever, chills, fatigue, weakness. Eyes: Negative for vision changes.  ENT: Negative for hoarseness, difficulty swallowing , nasal congestion. CV: Negative for chest pain, angina, palpitations, dyspnea on exertion, peripheral edema.  Respiratory: Negative for dyspnea at rest, dyspnea on exertion,  cough, sputum, wheezing.  GI: See history of present illness. GU:  Negative for dysuria, hematuria, urinary incontinence, urinary frequency, nocturnal urination.  MS: Negative for joint pain, low back pain.  Derm: Negative for rash or itching.  Neuro: Negative for weakness, abnormal sensation, seizure, frequent headaches, memory loss, confusion.  Psych: Negative for anxiety, depression, suicidal ideation, hallucinations.  Endo: Negative for unusual weight change.  Heme: Negative for bruising or bleeding. Allergy: Negative for rash or hives.    Physical Examination:  BP (!) 147/76   Pulse 72   Temp 97.3 F (36.3 C) (Oral)   Ht 5\' 4"  (1.626 m)   Wt 135 lb 6.4 oz (61.4 kg)   BMI 23.24 kg/m    General: Well-nourished, well-developed in no acute distress.  Head: Normocephalic, atraumatic.   Eyes: Conjunctiva pink, no icterus. Mouth: Oropharyngeal mucosa moist and pink , no lesions erythema or exudate. Neck: Supple without thyromegaly, masses, or lymphadenopathy.  Lungs: Clear to auscultation bilaterally.  Heart: Regular rate and rhythm, no murmurs rubs or gallops.  Abdomen: Bowel sounds are normal, nontender, nondistended, no hepatosplenomegaly or masses, no abdominal bruits or    hernia , no rebound or guarding.   Rectal: Not performed Extremities: No lower extremity edema. No clubbing or deformities.  Neuro: Alert and oriented x 4 , grossly normal neurologically.  Skin: Warm and dry, no rash or jaundice.   Psych: Alert and cooperative, normal mood and affect.  Labs: Lab Results  Component Value Date   CREATININE 1.45 (H) 10/05/2016   BUN 32 (H) 10/05/2016   NA 139 10/05/2016   K 4.3 10/05/2016   CL 102 10/05/2016   CO2 27 10/05/2016   Lab Results  Component Value Date   ALT 26 10/05/2016   AST 23 10/05/2016   ALKPHOS 100 10/05/2016   BILITOT 0.5 10/05/2016   Lab Results  Component Value Date   WBC 6.3 10/05/2016   HGB 12.9 10/05/2016   HCT 39.4 10/05/2016   MCV  93.1 10/05/2016   PLT 147 (L) 10/05/2016     Imaging Studies: No results found.

## 2017-02-07 NOTE — Patient Instructions (Signed)
Colonoscopy as scheduled.  Please see separate instructions. 

## 2017-02-07 NOTE — Patient Instructions (Signed)
PA info submitted via [facility] website. No PA needed. Decision ID# [id number].

## 2017-02-08 ENCOUNTER — Encounter: Payer: Self-pay | Admitting: Gastroenterology

## 2017-02-08 NOTE — Progress Notes (Signed)
cc'ed to pcp °

## 2017-02-08 NOTE — Assessment & Plan Note (Signed)
82 year old lady diagnosed with stage IIB diffuse large B-cell lymphoma in 2016 treated with 4 cycles of chemotherapy, discontinued early (6 cycles planned). Also completed radiation therapy in January 2017. Presents for consideration of endoscopic evaluation of sigmoid colon which appeared abnormal on pretreatment and posttreatment PET scans in 2016. No prior colonoscopy to knowledge. No significant GI concerns. Would recommend complete colonoscopy at this time. Explained to patient that her entire colon needs to be evaluated because if she were to have malignancy in the left colon from a surgical standpoint is best noted the right colon has any abnormalities as well. Voiced understanding. Plan for colonoscopy in the near future.  I have discussed the risks, alternatives, benefits with regards to but not limited to the risk of reaction to medication, bleeding, infection, perforation and the patient is agreeable to proceed. Written consent to be obtained.

## 2017-03-06 ENCOUNTER — Encounter (HOSPITAL_COMMUNITY): Payer: Self-pay

## 2017-03-06 ENCOUNTER — Encounter (HOSPITAL_COMMUNITY)
Admission: RE | Disposition: A | Discharge status: Home / Self Care | Payer: Self-pay | Source: Ambulatory Visit | Attending: Gastroenterology

## 2017-03-06 ENCOUNTER — Ambulatory Visit (HOSPITAL_COMMUNITY)
Admission: RE | Admit: 2017-03-06 | Discharge: 2017-03-06 | Disposition: A | Discharge status: Home / Self Care | Payer: Medicare Other | Source: Ambulatory Visit | Attending: Gastroenterology | Admitting: Gastroenterology

## 2017-03-06 DIAGNOSIS — K644 Residual hemorrhoidal skin tags: Secondary | ICD-10-CM | POA: Insufficient documentation

## 2017-03-06 DIAGNOSIS — K5730 Diverticulosis of large intestine without perforation or abscess without bleeding: Secondary | ICD-10-CM | POA: Insufficient documentation

## 2017-03-06 DIAGNOSIS — K648 Other hemorrhoids: Secondary | ICD-10-CM | POA: Diagnosis not present

## 2017-03-06 DIAGNOSIS — C8330 Diffuse large B-cell lymphoma, unspecified site: Secondary | ICD-10-CM | POA: Insufficient documentation

## 2017-03-06 DIAGNOSIS — E039 Hypothyroidism, unspecified: Secondary | ICD-10-CM | POA: Insufficient documentation

## 2017-03-06 DIAGNOSIS — I129 Hypertensive chronic kidney disease with stage 1 through stage 4 chronic kidney disease, or unspecified chronic kidney disease: Secondary | ICD-10-CM | POA: Diagnosis not present

## 2017-03-06 DIAGNOSIS — K5732 Diverticulitis of large intestine without perforation or abscess without bleeding: Secondary | ICD-10-CM | POA: Insufficient documentation

## 2017-03-06 DIAGNOSIS — R933 Abnormal findings on diagnostic imaging of other parts of digestive tract: Secondary | ICD-10-CM | POA: Diagnosis not present

## 2017-03-06 DIAGNOSIS — M109 Gout, unspecified: Secondary | ICD-10-CM | POA: Diagnosis not present

## 2017-03-06 DIAGNOSIS — R948 Abnormal results of function studies of other organs and systems: Secondary | ICD-10-CM

## 2017-03-06 DIAGNOSIS — N189 Chronic kidney disease, unspecified: Secondary | ICD-10-CM | POA: Diagnosis not present

## 2017-03-06 DIAGNOSIS — Z800 Family history of malignant neoplasm of digestive organs: Secondary | ICD-10-CM | POA: Diagnosis not present

## 2017-03-06 DIAGNOSIS — Z7982 Long term (current) use of aspirin: Secondary | ICD-10-CM | POA: Insufficient documentation

## 2017-03-06 DIAGNOSIS — Z79899 Other long term (current) drug therapy: Secondary | ICD-10-CM | POA: Diagnosis not present

## 2017-03-06 HISTORY — PX: COLONOSCOPY: SHX5424

## 2017-03-06 SURGERY — COLONOSCOPY
Anesthesia: Moderate Sedation

## 2017-03-06 MED ORDER — MEPERIDINE HCL 100 MG/ML IJ SOLN
INTRAMUSCULAR | Status: AC
  Filled 2017-03-06: qty 2

## 2017-03-06 MED ORDER — STERILE WATER FOR IRRIGATION IR SOLN
Status: DC | PRN
  Administered 2017-03-06: 2.5 mL

## 2017-03-06 MED ORDER — MEPERIDINE HCL 100 MG/ML IJ SOLN
INTRAMUSCULAR | Status: DC | PRN
  Administered 2017-03-06 (×2): 25 mg via INTRAVENOUS

## 2017-03-06 MED ORDER — MIDAZOLAM HCL 5 MG/5ML IJ SOLN
INTRAMUSCULAR | Status: DC | PRN
  Administered 2017-03-06 (×2): 2 mg via INTRAVENOUS

## 2017-03-06 MED ORDER — SODIUM CHLORIDE 0.9 % IV SOLN
INTRAVENOUS | Status: DC
  Administered 2017-03-06: 10:00:00 via INTRAVENOUS

## 2017-03-06 MED ORDER — MIDAZOLAM HCL 5 MG/5ML IJ SOLN
INTRAMUSCULAR | Status: AC
  Filled 2017-03-06: qty 10

## 2017-03-06 NOTE — Op Note (Signed)
[facility] Patient Name: Cristina Gregory Procedure Date: 03/06/2017 10:15 AM MRN: [id number] Date of Birth: Jun 02, 1935 Attending MD: Barney Drain , MD CSN: [id number] Age: 82 Admit Type: Outpatient Procedure:                Colonoscopy, DIAGNOSTIC Indications:              Abnormal PET scan of the GI tract Providers:                Barney Drain, MD, Lurline Del, RN, Charlyne Petrin                            RN, RN Referring MD:             Robynn Pane PA-c Medicines:                Meperidine 50 mg IV, Midazolam 4 mg IV Complications:            No immediate complications. Estimated Blood Loss:     Estimated blood loss: none. Procedure:                Pre-Anesthesia Assessment:                           - Prior to the procedure, a History and Physical                            was performed, and patient medications and                            allergies were reviewed. The patient's tolerance of                            previous anesthesia was also reviewed. The risks                            and benefits of the procedure and the sedation                            options and risks were discussed with the patient.                            All questions were answered, and informed consent                            was obtained. Prior Anticoagulants: The patient has                            taken Plavix (clopidogrel), last dose was 1 day                            prior to procedure. ASA Grade Assessment: II - A                            patient with mild systemic disease. After reviewing  the risks and benefits, the patient was deemed in                            satisfactory condition to undergo the procedure.                            After obtaining informed consent, the colonoscope                            was passed under direct vision. Throughout the                            procedure, the patient's blood pressure, pulse,  and                            oxygen saturations were monitored continuously.                            INITAIL COLONOSCOPE MALFUNCTIONED/ The EC-[id number]                            ([id number]) scope was introduced through the anus and                            advanced to the 3 cm into the ileum. The                            colonoscopy was technically difficult and complex                            due to restricted mobility of the colon and an                            endoscope malfunction. Successful completion of the                            procedure was aided by changing endoscopes,                            straightening and shortening the scope to obtain                            bowel loop reduction and COLOWRAP. The patient                            tolerated the procedure fairly well. The quality of                            the bowel preparation was good. The terminal ileum,                            ileocecal valve, appendiceal orifice, and rectum  were photographed. Scope In: 10:42:53 AM Scope Out: 11:10:34 AM Scope Withdrawal Time: 0 hours 16 minutes 21 seconds  Total Procedure Duration: 0 hours 27 minutes 41 seconds  Findings:      The digital rectal exam findings include non-thrombosed external       hemorrhoids.      The terminal ileum appeared normal.      Multiple small and large-mouthed diverticula were found in the       recto-sigmoid colon and sigmoid colon.      Non-bleeding internal hemorrhoids were found during retroflexion. The       hemorrhoids were small. Impression:               - Non-thrombosed external hemorrhoids found on                            digital rectal exam.                           - The examined portion of the ileum was normal.                           - HEYPERMETABOLIC AREA ONPET SCAN DUE TO SEVERE                            Diverticulosis in the sigmoid colon.                           -  Non-bleeding internal hemorrhoids. Moderate Sedation:      Moderate (conscious) sedation was administered by the endoscopy nurse       and supervised by the endoscopist. The following parameters were       monitored: oxygen saturation, heart rate, blood pressure, and response       to care. Total physician intraservice time was 38 minutes. Recommendation:           - High fiber diet.                           - Continue present medications.                           - Patient has a contact number available for                            emergencies. The signs and symptoms of potential                            delayed complications were discussed with the                            patient. Return to normal activities tomorrow.                            Written discharge instructions were provided to the                            patient.                           -  No repeat colonoscopy due to age. Procedure Code(s):        --- Professional ---                           [phone], Colonoscopy, flexible; diagnostic, including                            collection of specimen(s) by brushing or washing,                            when performed (separate procedure)                           99152, Moderate sedation services provided by the                            same physician or other qualified health care                            professional performing the diagnostic or                            therapeutic service that the sedation supports,                            requiring the presence of an independent trained                            observer to assist in the monitoring of the                            patient's level of consciousness and physiological                            status; initial 15 minutes of intraservice time,                            patient age 47 years or older                           [phone], Moderate sedation services; each additional                             15 minutes intraservice time                           99153, Moderate sedation services; each additional                            15 minutes intraservice time Diagnosis Code(s):        --- Professional ---                           K64.4, Residual hemorrhoidal skin tags  K64.8, Other hemorrhoids                           K57.30, Diverticulosis of large intestine without                            perforation or abscess without bleeding                           R93.3, Abnormal findings on diagnostic imaging of                            other parts of digestive tract CPT copyright 2016 American Medical Association. All rights reserved. The codes documented in this report are preliminary and upon coder review may  be revised to meet current compliance requirements. Barney Drain, MD Barney Drain, MD 03/06/2017 11:23:55 AM This report has been signed electronically. Number of Addenda: 0

## 2017-03-06 NOTE — H&P (Signed)
Primary Care Physician:  Manon Hilding, MD Primary Gastroenterologist:  Dr. Oneida Alar  Pre-Procedure History & Physical: HPI:  Cristina Gregory is a 82 y.o. female here for Abnormal PET scan: MID SIGMOID COLON.  Past Medical History:  Diagnosis Date  . Chronic renal insufficiency   . DLBCL (diffuse large B cell lymphoma) ([place]) 07/13/2016  . Gout   . Hypertension   . Hypothyroidism     Past Surgical History:  Procedure Laterality Date  . Port-A-Cath placement    . SLT LASER APPLICATION Right 9/37/9024   Procedure: SLT LASER APPLICATION;  Surgeon: Williams Che, MD;  Location: [facility];  Service: Ophthalmology;  Laterality: Right;    Prior to Admission medications   Medication Sig Start Date End Date Taking? Authorizing Provider  allopurinol (ZYLOPRIM) 300 MG tablet Take 300 mg by mouth daily at 12 noon.    Yes [provider]  amLODipine (NORVASC) 5 MG tablet Take 5 mg by mouth daily at 12 noon.    Yes [provider]  aspirin 81 MG EC tablet Take 81 mg by mouth daily at 12 noon.    Yes [provider]  clopidogrel (PLAVIX) 75 MG tablet Take 75 mg by mouth daily at 12 noon.    Yes [provider]  hydrochlorothiazide (HYDRODIURIL) 12.5 MG tablet Take 12.5 mg by mouth daily at 12 noon.   Yes [provider]  levothyroxine (SYNTHROID, LEVOTHROID) 88 MCG tablet Take 88 mcg by mouth daily before breakfast.   Yes [provider]  lidocaine-prilocaine (EMLA) cream Apply 1 application topically as needed (port access).   Yes [provider]  metoprolol tartrate (LOPRESSOR) 25 MG tablet Take 25 mg by mouth 2 (two) times daily.   Yes [provider]  polyethylene glycol (MIRALAX / GLYCOLAX) packet Take 17 g by mouth daily.   Yes [provider]  polyethylene glycol-electrolytes (TRILYTE) 420 g solution Take 4,000 mLs by mouth as directed. 02/07/17  Yes Annamary Buschman L, MD  simvastatin (ZOCOR) 20 MG tablet Take  20 mg by mouth at bedtime.    Yes [provider]    Allergies as of 02/07/2017  . (No Known Allergies)    Family History  Problem Relation Age of Onset  . Colon cancer Mother        Patient states her mother has been: Issues before it could be evaluated she died from heart disease.    Social History   Social History  . Marital status: Single    Spouse name: N/A  . Number of children: 4  . Years of education: N/A   Occupational History  . Not on file.   Social History Main Topics  . Smoking status: Never Smoker  . Smokeless tobacco: Never Used  . Alcohol use No  . Drug use: No  . Sexual activity: Not on file   Other Topics Concern  . Not on file   Social History Narrative  . No narrative on file    Review of Systems: See HPI, otherwise negative ROS   Physical Exam: BP (!) 150/64   Pulse 82   Temp 98.6 F (37 C) (Oral)   Resp 16   Ht 5\' 4"  (1.626 m)   Wt 135 lb (61.2 kg)   SpO2 100%   BMI 23.17 kg/m  General:   Alert,  pleasant and cooperative in NAD Head:  Normocephalic and atraumatic. Neck:  Supple; Lungs:  Clear throughout to auscultation.    Heart:  Regular rate and rhythm. Abdomen:  Soft, nontender and nondistended. Normal bowel sounds, without guarding, and without rebound.   Neurologic:  Alert and  oriented x4;  grossly normal neurologically.  Impression/Plan:     Abnormal PET scan: MID SIGMOID COLON  Plan:  TCS TODAY. DISCUSSED PROCEDURE, BENEFITS, & RISKS: < 1% chance of medication reaction, bleeding, perforation, or rupture of spleen/liver.

## 2017-03-06 NOTE — Discharge Instructions (Signed)
You have LARGE EXTERNAL AND SMALL INTERNAL hemorrhoids. YOU HAVE SEVERE diverticulosis IN YOUR  SIGMOID COLON, WHICH IS the LIKELY REASON FOR PERSISTENT FINDING ON PET SCAN    FOLLOW A HIGH FIBER DIET. AVOID ITEMS THAT CAUSE BLOATING. SEE INFO BELOW.  DRINK ENOUGH WATER TO KEEP URINE LIGHT YELLOW.  USE PREPARATION H OR ANUSOL SUPPOSITORIES FOUR TIMES  A DAY FOR 7 DAYS IF NEEDED TO RELIEVE RECTAL PAIN/PRESSURE/BLEEDING.  We do not routinely screen for polyps after the age of 82.     Colonoscopy Care After Read the instructions outlined below and refer to this sheet in the next week. These discharge instructions provide you with general information on caring for yourself after you leave the hospital. While your treatment has been planned according to the most current medical practices available, unavoidable complications occasionally occur. If you have any problems or questions after discharge, call DR. Yehia Mcbain, [phone].  ACTIVITY  You may resume your regular activity, but move at a slower pace for the next 24 hours.   Take frequent rest periods for the next 24 hours.   Walking will help get rid of the air and reduce the bloated feeling in your belly (abdomen).   No driving for 24 hours (because of the medicine (anesthesia) used during the test).   You may shower.   Do not sign any important legal documents or operate any machinery for 24 hours (because of the anesthesia used during the test).    NUTRITION  Drink plenty of fluids.   You may resume your normal diet as instructed by your doctor.   Begin with a light meal and progress to your normal diet. Heavy or fried foods are harder to digest and may make you feel sick to your stomach (nauseated).   Avoid alcoholic beverages for 24 hours or as instructed.    MEDICATIONS  You may resume your normal medications.   WHAT YOU CAN EXPECT TODAY  Some feelings of bloating in the abdomen.   Passage of more gas than  usual.   Spotting of blood in your stool or on the toilet paper  .  IF YOU HAD POLYPS REMOVED DURING THE COLONOSCOPY:  Eat a soft diet IF YOU HAVE NAUSEA, BLOATING, ABDOMINAL PAIN, OR VOMITING.    FINDING OUT THE RESULTS OF YOUR TEST Not all test results are available during your visit. DR. Oneida Alar WILL CALL YOU WITHIN 7 DAYS OF YOUR PROCEDUE WITH YOUR RESULTS. Do not assume everything is normal if you have not heard from DR. Frady Taddeo IN ONE WEEK, CALL HER OFFICE AT [phone].  SEEK IMMEDIATE MEDICAL ATTENTION AND CALL THE OFFICE: [phone] IF:  You have more than a spotting of blood in your stool.   Your belly is swollen (abdominal distention).   You are nauseated or vomiting.   You have a temperature over 101F.   You have abdominal pain or discomfort that is severe or gets worse throughout the day.  High-Fiber Diet A high-fiber diet changes your normal diet to include more whole grains, legumes, fruits, and vegetables. Changes in the diet involve replacing refined carbohydrates with unrefined foods. The calorie level of the diet is essentially unchanged. The Dietary Reference Intake (recommended amount) for adult males is 38 grams per day. For adult females, it is 25 grams per day. Pregnant and lactating women should consume 28 grams of fiber per day. Fiber is the intact part of a plant that is not broken down during digestion. Functional fiber is fiber that  has been isolated from the plant to provide a beneficial effect in the body. PURPOSE  Increase stool bulk.   Ease and regulate bowel movements.   Lower cholesterol.   REDUCE RISK OF COLON CANCER  INDICATIONS THAT YOU NEED MORE FIBER  Constipation and hemorrhoids.   Uncomplicated diverticulosis (intestine condition) and irritable bowel syndrome.   Weight management.   As a protective measure against hardening of the arteries (atherosclerosis), diabetes, and cancer.   GUIDELINES FOR INCREASING FIBER IN THE  DIET  Start adding fiber to the diet slowly. A gradual increase of about 5 more grams (2 slices of whole-wheat bread, 2 servings of most fruits or vegetables, or 1 bowl of high-fiber cereal) per day is best. Too rapid an increase in fiber may result in constipation, flatulence, and bloating.   Drink enough water and fluids to keep your urine clear or pale yellow. Water, juice, or caffeine-free drinks are recommended. Not drinking enough fluid may cause constipation.   Eat a variety of high-fiber foods rather than one type of fiber.   Try to increase your intake of fiber through using high-fiber foods rather than fiber pills or supplements that contain small amounts of fiber.   The goal is to change the types of food eaten. Do not supplement your present diet with high-fiber foods, but replace foods in your present diet.   INCLUDE A VARIETY OF FIBER SOURCES  Replace refined and processed grains with whole grains, canned fruits with fresh fruits, and incorporate other fiber sources. White rice, white breads, and most bakery goods contain little or no fiber.   Brown whole-grain rice, buckwheat oats, and many fruits and vegetables are all good sources of fiber. These include: broccoli, Brussels sprouts, cabbage, cauliflower, beets, sweet potatoes, white potatoes (skin on), carrots, tomatoes, eggplant, squash, berries, fresh fruits, and dried fruits.   Cereals appear to be the richest source of fiber. Cereal fiber is found in whole grains and bran. Bran is the fiber-rich outer coat of cereal grain, which is largely removed in refining. In whole-grain cereals, the bran remains. In breakfast cereals, the largest amount of fiber is found in those with "bran" in their names. The fiber content is sometimes indicated on the label.   You may need to include additional fruits and vegetables each day.   In baking, for 1 cup white flour, you may use the following substitutions:   1 cup whole-wheat flour  minus 2 tablespoons.   1/2 cup white flour plus 1/2 cup whole-wheat flour.    Diverticulosis Diverticulosis is a common condition that develops when small pouches (diverticula) form in the wall of the colon. The risk of diverticulosis increases with age. It happens more often in people who eat a low-fiber diet. Most individuals with diverticulosis have no symptoms. Those individuals with symptoms usually experience belly (abdominal) pain, constipation, or loose stools (diarrhea).  HOME CARE INSTRUCTIONS  Increase the amount of fiber in your diet as directed by your caregiver or dietician. This may reduce symptoms of diverticulosis.   Drink at least 6 to 8 glasses of water each day to prevent constipation.   Try not to strain when you have a bowel movement.   Avoiding nuts and seeds to prevent complications is NOT NECESSARY.   FOODS HAVING HIGH FIBER CONTENT INCLUDE:  Fruits. Apple, peach, pear, tangerine, raisins, prunes.   Vegetables. Brussels sprouts, asparagus, broccoli, cabbage, carrot, cauliflower, romaine lettuce, spinach, summer squash, tomato, winter squash, zucchini.   Starchy Vegetables. Baked beans,  kidney beans, lima beans, split peas, lentils, potatoes (with skin).   Grains. Whole wheat bread, brown rice, bran flake cereal, plain oatmeal, white rice, shredded wheat, bran muffins.    SEEK IMMEDIATE MEDICAL CARE IF:  You develop increasing pain or severe bloating.   You have an oral temperature above 101F.   You develop vomiting or bowel movements that are bloody or black.   Hemorrhoids Hemorrhoids are dilated (enlarged) veins around the rectum. Sometimes clots will form in the veins. This makes them swollen and painful. These are called thrombosed hemorrhoids. Causes of hemorrhoids include:  Constipation.   Straining to have a bowel movement.   HEAVY LIFTING  HOME CARE INSTRUCTIONS  Eat a well balanced diet and drink 6 to 8 glasses of water every day to  avoid constipation. You may also use a bulk laxative.   Avoid straining to have bowel movements.   Keep anal area dry and clean.   Do not use a donut shaped pillow or sit on the toilet for long periods. This increases blood pooling and pain.   Move your bowels when your body has the urge; this will require less straining and will decrease pain and pressure.

## 2017-03-10 ENCOUNTER — Encounter (HOSPITAL_COMMUNITY): Payer: Self-pay | Admitting: Gastroenterology

## 2017-04-04 ENCOUNTER — Encounter (HOSPITAL_COMMUNITY): Payer: Self-pay

## 2017-04-05 ENCOUNTER — Encounter (HOSPITAL_COMMUNITY): Payer: Self-pay

## 2017-04-05 ENCOUNTER — Encounter (HOSPITAL_COMMUNITY): Payer: Medicare Other | Attending: Oncology

## 2017-04-05 VITALS — BP 152/46 | HR 73 | Temp 98.3°F | Resp 18

## 2017-04-05 DIAGNOSIS — Z95828 Presence of other vascular implants and grafts: Secondary | ICD-10-CM | POA: Insufficient documentation

## 2017-04-05 DIAGNOSIS — Z452 Encounter for adjustment and management of vascular access device: Secondary | ICD-10-CM | POA: Diagnosis not present

## 2017-04-05 DIAGNOSIS — C8330 Diffuse large B-cell lymphoma, unspecified site: Secondary | ICD-10-CM | POA: Diagnosis not present

## 2017-04-05 MED ORDER — SODIUM CHLORIDE 0.9% FLUSH
10.0000 mL | INTRAVENOUS | Status: DC | PRN
  Administered 2017-04-05: 10 mL via INTRAVENOUS
  Filled 2017-04-05: qty 10

## 2017-04-05 MED ORDER — HEPARIN SOD (PORK) LOCK FLUSH 100 UNIT/ML IV SOLN
500.0000 [IU] | Freq: Once | INTRAVENOUS | Status: AC
  Administered 2017-04-05: 500 [IU] via INTRAVENOUS

## 2017-04-05 NOTE — Progress Notes (Signed)
Cristina Gregory presented for Portacath access and flush. Portacath located left chest wall accessed with  H 20 needle. Blood return noted Portacath flushed with 10ml NS and 500U/17ml Heparin and needle removed intact. Procedure without incident. Patient tolerated procedure well. Vitals stable and discharged home from clinic ambulatory. Follow up as scheduled.

## 2017-04-05 NOTE — Patient Instructions (Signed)
[facility] at [facility] Discharge Instructions  RECOMMENDATIONS MADE BY THE CONSULTANT AND ANY TEST RESULTS WILL BE SENT TO YOUR REFERRING PHYSICIAN.  Port flush done today. Follow up as scheduled.  Thank you for choosing [facility] at [facility] to provide your oncology and hematology care.  To afford each patient quality time with our provider, please arrive at least 15 minutes before your scheduled appointment time.    If you have a lab appointment with the Cancer Center please come in thru the  Main Entrance and check in at the main information desk  You need to re-schedule your appointment should you arrive 10 or more minutes late.  We strive to give you quality time with our providers, and arriving late affects you and other patients whose appointments are after yours.  Also, if you no show three or more times for appointments you may be dismissed from the clinic at the providers discretion.     Again, thank you for choosing [facility].  Our hope is that these requests will decrease the amount of time that you wait before being seen by our physicians.       _____________________________________________________________  Should you have questions after your visit to [facility], please contact our office at [phone] between the hours of 8:30 a.m. and 4:30 p.m.  Voicemails left after 4:30 p.m. will not be returned until the following business day.  For prescription refill requests, have your pharmacy contact our office.       Resources For Cancer Patients and their Caregivers ? American Cancer Society: Can assist with transportation, wigs, general needs, runs Look Good Feel Better.        1-888-227-6333 ? Cancer Care: Provides financial assistance, online support groups, medication/co-pay assistance.  1-800-813-HOPE (4673) ? [facility] Assists [place] cancer patients and  their families through emotional , educational and financial support.  336-427-4357 ? [place] DSS Where to apply for food stamps, Medicaid and utility assistance. 336-342-1394 ? [facility]: Transportation to medical appointments. 336-347-2287 ? Social Security Administration: May apply for disability if have a Stage IV cancer. 336-342-7796 1-800-772-1213 ? [place] Aging, Disability and Transit Services: Assists with nutrition, care and transit needs. 336-349-2343  Cancer Center Support Programs: @10RELATIVEDAYS@ > Cancer Support Group  2nd Tuesday of the month 1pm-2pm, Journey Room  > Creative Journey  3rd Tuesday of the month 1130am-1pm, Journey Room  > Look Good Feel Better  1st Wednesday of the month 10am-12 noon, Journey Room (Call American [employer] to register 1-800-395-5775)   

## 2017-05-09 DIAGNOSIS — E875 Hyperkalemia: Secondary | ICD-10-CM | POA: Diagnosis not present

## 2017-05-09 DIAGNOSIS — E1129 Type 2 diabetes mellitus with other diabetic kidney complication: Secondary | ICD-10-CM | POA: Diagnosis not present

## 2017-05-09 DIAGNOSIS — E7800 Pure hypercholesterolemia, unspecified: Secondary | ICD-10-CM | POA: Diagnosis not present

## 2017-05-09 DIAGNOSIS — E039 Hypothyroidism, unspecified: Secondary | ICD-10-CM | POA: Diagnosis not present

## 2017-05-09 DIAGNOSIS — E782 Mixed hyperlipidemia: Secondary | ICD-10-CM | POA: Diagnosis not present

## 2017-05-09 DIAGNOSIS — E1121 Type 2 diabetes mellitus with diabetic nephropathy: Secondary | ICD-10-CM | POA: Diagnosis not present

## 2017-05-09 DIAGNOSIS — C8589 Other specified types of non-Hodgkin lymphoma, extranodal and solid organ sites: Secondary | ICD-10-CM | POA: Diagnosis not present

## 2017-05-09 DIAGNOSIS — I10 Essential (primary) hypertension: Secondary | ICD-10-CM | POA: Diagnosis not present

## 2017-05-12 DIAGNOSIS — N183 Chronic kidney disease, stage 3 (moderate): Secondary | ICD-10-CM | POA: Diagnosis not present

## 2017-05-12 DIAGNOSIS — I10 Essential (primary) hypertension: Secondary | ICD-10-CM | POA: Diagnosis not present

## 2017-05-12 DIAGNOSIS — C8589 Other specified types of non-Hodgkin lymphoma, extranodal and solid organ sites: Secondary | ICD-10-CM | POA: Diagnosis not present

## 2017-05-12 DIAGNOSIS — Z1389 Encounter for screening for other disorder: Secondary | ICD-10-CM | POA: Diagnosis not present

## 2017-05-12 DIAGNOSIS — Z6823 Body mass index (BMI) 23.0-23.9, adult: Secondary | ICD-10-CM | POA: Diagnosis not present

## 2017-05-12 DIAGNOSIS — K5901 Slow transit constipation: Secondary | ICD-10-CM | POA: Diagnosis not present

## 2017-05-12 DIAGNOSIS — M109 Gout, unspecified: Secondary | ICD-10-CM | POA: Diagnosis not present

## 2017-05-12 DIAGNOSIS — E1129 Type 2 diabetes mellitus with other diabetic kidney complication: Secondary | ICD-10-CM | POA: Diagnosis not present

## 2017-06-05 ENCOUNTER — Ambulatory Visit (HOSPITAL_COMMUNITY): Payer: Self-pay | Admitting: Oncology

## 2017-06-05 ENCOUNTER — Other Ambulatory Visit (HOSPITAL_COMMUNITY): Payer: Self-pay

## 2017-06-07 ENCOUNTER — Ambulatory Visit (HOSPITAL_COMMUNITY): Payer: Self-pay

## 2017-06-07 ENCOUNTER — Other Ambulatory Visit (HOSPITAL_COMMUNITY): Payer: Self-pay

## 2017-07-10 ENCOUNTER — Encounter (HOSPITAL_COMMUNITY): Payer: Medicare HMO | Attending: Oncology | Admitting: Oncology

## 2017-07-10 ENCOUNTER — Encounter (HOSPITAL_COMMUNITY): Payer: Self-pay | Admitting: Oncology

## 2017-07-10 ENCOUNTER — Encounter (HOSPITAL_BASED_OUTPATIENT_CLINIC_OR_DEPARTMENT_OTHER): Payer: Medicare HMO

## 2017-07-10 ENCOUNTER — Encounter (HOSPITAL_COMMUNITY): Payer: Self-pay

## 2017-07-10 VITALS — BP 141/71 | HR 77 | Temp 98.9°F | Resp 16 | Wt 135.2 lb

## 2017-07-10 DIAGNOSIS — Z95828 Presence of other vascular implants and grafts: Secondary | ICD-10-CM

## 2017-07-10 DIAGNOSIS — C8330 Diffuse large B-cell lymphoma, unspecified site: Secondary | ICD-10-CM | POA: Diagnosis not present

## 2017-07-10 LAB — COMPREHENSIVE METABOLIC PANEL
ALBUMIN: 4.1 g/dL (ref 3.5–5.0)
ALK PHOS: 100 U/L (ref 38–126)
ALT: 19 U/L (ref 14–54)
AST: 21 U/L (ref 15–41)
Anion gap: 11 (ref 5–15)
BILIRUBIN TOTAL: 0.3 mg/dL (ref 0.3–1.2)
BUN: 32 mg/dL — AB (ref 6–20)
CALCIUM: 9.6 mg/dL (ref 8.9–10.3)
CO2: 25 mmol/L (ref 22–32)
CREATININE: 1.24 mg/dL — AB (ref 0.44–1.00)
Chloride: 105 mmol/L (ref 101–111)
GFR calc Af Amer: 46 mL/min — ABNORMAL LOW (ref >60)
GFR calc non Af Amer: 40 mL/min — ABNORMAL LOW (ref >60)
GLUCOSE: 170 mg/dL — AB (ref 65–99)
Potassium: 3.9 mmol/L (ref 3.5–5.1)
SODIUM: 141 mmol/L (ref 135–145)
TOTAL PROTEIN: 7.4 g/dL (ref 6.5–8.1)

## 2017-07-10 LAB — CBC WITH DIFFERENTIAL/PLATELET
Basophils Absolute: 0 10*3/uL (ref 0.0–0.1)
Basophils Relative: 0 %
Eosinophils Absolute: 0.2 10*3/uL (ref 0.0–0.7)
Eosinophils Relative: 2 %
HEMATOCRIT: 39.7 % (ref 36.0–46.0)
HEMOGLOBIN: 12.9 g/dL (ref 12.0–15.0)
LYMPHS ABS: 2.5 10*3/uL (ref 0.7–4.0)
LYMPHS PCT: 29 %
MCH: 29.9 pg (ref 26.0–34.0)
MCHC: 32.5 g/dL (ref 30.0–36.0)
MCV: 91.9 fL (ref 78.0–100.0)
MONO ABS: 0.7 10*3/uL (ref 0.1–1.0)
MONOS PCT: 8 %
NEUTROS ABS: 5.1 10*3/uL (ref 1.7–7.7)
NEUTROS PCT: 61 %
Platelets: 177 10*3/uL (ref 150–400)
RBC: 4.32 MIL/uL (ref 3.87–5.11)
RDW: 15 % (ref 11.5–15.5)
WBC: 8.5 10*3/uL (ref 4.0–10.5)

## 2017-07-10 LAB — LACTATE DEHYDROGENASE: LDH: 120 U/L (ref 98–192)

## 2017-07-10 MED ORDER — HEPARIN SOD (PORK) LOCK FLUSH 100 UNIT/ML IV SOLN
INTRAVENOUS | Status: AC
  Filled 2017-07-10: qty 5

## 2017-07-10 MED ORDER — SODIUM CHLORIDE 0.9% FLUSH
10.0000 mL | INTRAVENOUS | Status: DC | PRN
  Administered 2017-07-10: 10 mL via INTRAVENOUS
  Filled 2017-07-10: qty 10

## 2017-07-10 MED ORDER — HEPARIN SOD (PORK) LOCK FLUSH 100 UNIT/ML IV SOLN
500.0000 [IU] | Freq: Once | INTRAVENOUS | Status: AC
  Administered 2017-07-10: 500 [IU] via INTRAVENOUS

## 2017-07-10 NOTE — Progress Notes (Signed)
Cristina Gregory presented for Portacath access and flush. Portacath located left chest wall accessed with  H 20 needle. Good blood return present. Portacath flushed with 55ml NS and 500U/8ml Heparin and needle removed intact. Procedure without incident. Patient tolerated procedure well. Patient tolerated treatment without incidence. Patient discharged ambulatory and in stable condition from clinic. Patient to follow up as scheduled.

## 2017-07-10 NOTE — Patient Instructions (Signed)
[facility] at [facility] Discharge Instructions  RECOMMENDATIONS MADE BY THE CONSULTANT AND ANY TEST RESULTS WILL BE SENT TO YOUR REFERRING PHYSICIAN.  You had your port flushed and labs drawn today. Continue getting it flushed every 8 weeks. We will call you with the lab results. Follow up as scheduled.  Thank you for choosing [facility] at [facility] to provide your oncology and hematology care.  To afford each patient quality time with our provider, please arrive at least 15 minutes before your scheduled appointment time.    If you have a lab appointment with the [facility] please come in thru the  Main Entrance and check in at the main information desk  You need to re-schedule your appointment should you arrive 10 or more minutes late.  We strive to give you quality time with our providers, and arriving late affects you and other patients whose appointments are after yours.  Also, if you no show three or more times for appointments you may be dismissed from the clinic at the providers discretion.     Again, thank you for choosing [facility].  Our hope is that these requests will decrease the amount of time that you wait before being seen by our physicians.       _____________________________________________________________  Should you have questions after your visit to [facility], please contact our office at [phone] between the hours of 8:30 a.m. and 4:30 p.m.  Voicemails left after 4:30 p.m. will not be returned until the following business day.  For prescription refill requests, have your pharmacy contact our office.       Resources For Cancer Patients and their Caregivers ? American Cancer Society: Can assist with transportation, wigs, general needs, runs Look Good Feel Better.        938-102-6251 ? Cancer Care: Provides financial assistance, online support groups, medication/co-pay  assistance.  1-800-813-HOPE 912-364-2024) ? [facility] Assists [place] cancer patients and their families through emotional , educational and financial support.  (678) 789-2731 ? [place] DSS Where to apply for food stamps, Medicaid and utility assistance. 254 181 2574 ? [facility]: Transportation to medical appointments. (531) 487-6075 ? Social Security Administration: May apply for disability if have a Stage IV cancer. 662-011-4505 203-526-9053 ? [employer], Disability and Transit Services: Assists with nutrition, care and transit needs. [place] Support Programs: @10RELATIVEDAYS @ > Cancer Support Group  2nd Tuesday of the month 1pm-2pm, Journey Room  > Creative Journey  3rd Tuesday of the month 1130am-1pm, Journey Room  > Look Good Feel Better  1st Wednesday of the month 10am-12 noon, Journey Room (Call [place] to register [phone])

## 2017-07-10 NOTE — Progress Notes (Addendum)
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  Diagnosis via right supraclavicular/cervical lymph node biopsy.      05/26/2015 Cancer Staging    Stage IIB      05/26/2015 PET scan    Hypermetabolic right supraclavicular adenopathy. Mild hypermetabolic AP window lymph node. A focal hypermetabolic wall thickening in the mid sigmoid colon, correlate for acute diverticulitis versus mass. No hypermetabolic right lung base nodule, likely too small for evaluation by PET      05/28/2015 Echocardiogram    Left ventricle is normal in size. Mild concentric left ventricular hypertrophy. Left ventricle ejection fraction is normal (60-65 percent). Left ventricular diastolic function is abnormal. There is mild aortic regurgitation.      06/04/2015 -  Chemotherapy    RCHOP, administered by Dr. Tressie Stalker at [facility].  Dose reduction beginning on cycle two.  Due to complications, received 4 cycles of chemotherapy. Her last 2 cycles cancelled.      07/06/2015 Adverse Reaction    Hospitalized with neutropenic fever but negative blood cultures      08/03/2015 Remission    PET scan shows complete remission after 3 cycles of chemotherapy      08/03/2015 PET scan    A near complete resolution of right supraclavicular nodes. No hypermetabolic activity remaining. Stable activity within the left thyroid lobe. Ultrasound for further evaluation if clinically indicated. Stable 3 mm right basilar nodule. Nonspecific activity in the sigmoid colon is increased.      08/20/2015 Adverse  Reaction    Hospitalized after cycle 4 with neutropenic fever, thrombocytopenia, and Staphylococcus epidermidis blood culture positive 1      09/11/2015 - 10/30/2015 Radiation Therapy    36 Gy delivered to areas of previous involvement due to inability to tolerate further chemotherapy.        HISTORY OF PRESENT ILLNESS:  (From Kirby Crigler, PA-C's last note on 07/13/16)     INTERVAL HISTORY:  Ms. Arenson returns for follow-up of her history of diffuse large B-cell lymphoma.  Patient presents with her duaghter today for continued follow up. She states that she's noted an area of "slickness" on the roof of her mouth which started a few weeks ago. The area is not painful and does not prevent her from eating. Denies any fever, chills, night sweats, or unintentional weight loss. Her appetite is good; energy levels are fair but at her baseline. She denies any new mass or adenopathy.  REVIEW OF SYSTEMS:  Review of Systems  Constitutional: Positive for fatigue. Negative for appetite change, chills, fever and unexpected weight change.  HENT:  Negative.  Negative for lump/mass and sore throat.   Eyes: Negative.   Respiratory: Negative.  Negative for cough and shortness of breath.   Cardiovascular: Negative.  Negative for chest pain and palpitations.  Gastrointestinal: Negative.  Negative for abdominal pain, blood in stool, constipation, diarrhea, nausea and vomiting.  Endocrine: Negative.   Genitourinary: Negative.  Negative for dysuria and hematuria.   Musculoskeletal: Negative.   Skin: Negative.  Negative for rash.  Neurological: Negative.  Negative  for dizziness and headaches.  Hematological: Negative.  Negative for adenopathy. Does not bruise/bleed easily.  Psychiatric/Behavioral: Positive for depression.     PAST MEDICAL/SURGICAL HISTORY:  Past Medical History:  Diagnosis Date  . Chronic renal insufficiency   . DLBCL (diffuse large B cell lymphoma) ([place]) 07/13/2016  . Gout   .  Hypertension   . Hypothyroidism    Past Surgical History:  Procedure Laterality Date  . COLONOSCOPY N/A 03/06/2017   Procedure: COLONOSCOPY;  Surgeon: Danie Binder, MD;  Location: [facility] ENDO SUITE;  Service: Endoscopy;  Laterality: N/A;  10:00am  . Port-A-Cath placement    . SLT LASER APPLICATION Right 0/86/5784   Procedure: SLT LASER APPLICATION;  Surgeon: Williams Che, MD;  Location: [facility] ORS;  Service: Ophthalmology;  Laterality: Right;     SOCIAL HISTORY:  Social History   Social History  . Marital status: Single    Spouse name: N/A  . Number of children: 4  . Years of education: N/A   Occupational History  . Not on file.   Social History Main Topics  . Smoking status: Never Smoker  . Smokeless tobacco: Never Used  . Alcohol use No  . Drug use: No  . Sexual activity: Not on file   Other Topics Concern  . Not on file   Social History Narrative  . No narrative on file    FAMILY HISTORY:  Family History  Problem Relation Age of Onset  . Colon cancer Mother        Patient states her mother has been: Issues before it could be evaluated she died from heart disease.    CURRENT MEDICATIONS:  Outpatient Encounter Prescriptions as of 07/10/2017  Medication Sig  . allopurinol (ZYLOPRIM) 300 MG tablet Take 300 mg by mouth daily at 12 noon.   Marland Kitchen amLODipine (NORVASC) 5 MG tablet Take 5 mg by mouth daily at 12 noon.   Marland Kitchen aspirin 81 MG EC tablet Take 81 mg by mouth daily at 12 noon.   . clopidogrel (PLAVIX) 75 MG tablet Take 75 mg by mouth daily at 12 noon.   . hydrochlorothiazide (HYDRODIURIL) 12.5 MG tablet Take 12.5 mg by mouth daily at 12 noon.  Marland Kitchen levothyroxine (SYNTHROID, LEVOTHROID) 88 MCG tablet Take 88 mcg by mouth daily before breakfast.  . lidocaine-prilocaine (EMLA) cream Apply 1 application topically as needed (port access).  . metoprolol tartrate (LOPRESSOR) 25 MG tablet Take 25 mg by mouth 2 (two) times daily.  . polyethylene glycol (MIRALAX / GLYCOLAX) packet  Take 17 g by mouth daily.  . simvastatin (ZOCOR) 20 MG tablet Take 20 mg by mouth at bedtime.    No facility-administered encounter medications on file as of 07/10/2017.     ALLERGIES:  No Known Allergies   PHYSICAL EXAM:  ECOG Performance status: 0 - Independent   Vitals:   07/10/17 1531  BP: (!) 141/71  Pulse: 77  Resp: 16  Temp: 98.9 F (37.2 C)  SpO2: 100%   Filed Weights   07/10/17 1531  Weight: 135 lb 3.2 oz (61.3 kg)    Physical Exam  Constitutional: She is oriented to person, place, and time and well-developed, well-nourished, and in no distress.  HENT:  Head: Normocephalic.  Mouth/Throat: Oropharynx is clear and moist. No oropharyngeal exudate.  No masses seen in the mouth.  Eyes: Pupils are equal, round, and reactive to light. Conjunctivae are normal. No scleral icterus.  Neck: Normal range of motion. Neck supple.  Cardiovascular: Normal rate, regular  rhythm and normal heart sounds.   Pulmonary/Chest: Effort normal and breath sounds normal. No respiratory distress. She has no wheezes.  Abdominal: Soft. Bowel sounds are normal. There is no tenderness.  Musculoskeletal: Normal range of motion. She exhibits no edema.  Lymphadenopathy:    She has no cervical adenopathy.    She has no axillary adenopathy.       Right: No supraclavicular adenopathy present.       Left: No supraclavicular adenopathy present.  Neurological: She is alert and oriented to person, place, and time. No cranial nerve deficit. Gait normal.  Skin: Skin is warm and dry. No rash noted.  Psychiatric: Mood, memory, affect and judgment normal.     LABORATORY DATA:  I have reviewed the labs as listed.  CBC    Component Value Date/Time   WBC 8.5 07/10/2017 1535   RBC 4.32 07/10/2017 1535   HGB 12.9 07/10/2017 1535   HCT 39.7 07/10/2017 1535   PLT 177 07/10/2017 1535   MCV 91.9 07/10/2017 1535   MCH 29.9 07/10/2017 1535   MCHC 32.5 07/10/2017 1535   RDW 15.0 07/10/2017 1535   LYMPHSABS  2.5 07/10/2017 1535   MONOABS 0.7 07/10/2017 1535   EOSABS 0.2 07/10/2017 1535   BASOSABS 0.0 07/10/2017 1535   CMP Latest Ref Rng & Units 10/05/2016 07/13/2016  Glucose 65 - 99 mg/dL 149(H) 105(H)  BUN 6 - 20 mg/dL 32(H) 30(H)  Creatinine 0.44 - 1.00 mg/dL 1.45(H) 1.32(H)  Sodium 135 - 145 mmol/L 139 136  Potassium 3.5 - 5.1 mmol/L 4.3 3.9  Chloride 101 - 111 mmol/L 102 103  CO2 22 - 32 mmol/L 27 27  Calcium 8.9 - 10.3 mg/dL 10.4(H) 9.1  Total Protein 6.5 - 8.1 g/dL 7.2 7.6  Total Bilirubin 0.3 - 1.2 mg/dL 0.5 0.5  Alkaline Phos 38 - 126 U/L 100 132(H)  AST 15 - 41 U/L 23 23  ALT 14 - 54 U/L 26 26   Lab results from PCP office collected 11/08/16       Results for BRICIA, TAHER (MRN [id number])   Ref. Range 12/06/2016 12:07  LDH Latest Ref Range: 98 - 192 U/L 123    PENDING LABS:    DIAGNOSTIC IMAGING:  CT abd/pelvis [facility] 05/11/16      PATHOLOGY:     ASSESSMENT & PLAN:   History of Diffuse Large B-Cell Lymphoma:  -Clinically NED.  -Labs pending for today. CBC reviewed and is WNL. -RTC in 6 months for follow up with labs.     Orders Placed This Encounter  Procedures  . CBC with Differential    Standing Status:   Future    Standing Expiration Date:   07/10/2018  . Comprehensive metabolic panel    Standing Status:   Future    Standing Expiration Date:   07/10/2018  . Lactate dehydrogenase    Standing Status:   Future    Standing Expiration Date:   07/10/2018       Dispo:  -Return to cancer center in 6 months for continued surveillance.    All questions were answered to patient's stated satisfaction. Encouraged patient to call with any new concerns or questions before her next visit to the cancer center and we can certain see her sooner, if needed.      Twana First, MD

## 2017-07-10 NOTE — Patient Instructions (Signed)
[facility] at [facility] Discharge Instructions  RECOMMENDATIONS MADE BY THE CONSULTANT AND ANY TEST RESULTS WILL BE SENT TO YOUR REFERRING PHYSICIAN.  You saw Dr. Zhou today.  Thank you for choosing [facility] at [facility] to provide your oncology and hematology care.  To afford each patient quality time with our provider, please arrive at least 15 minutes before your scheduled appointment time.    If you have a lab appointment with the Cancer Center please come in thru the  Main Entrance and check in at the main information desk  You need to re-schedule your appointment should you arrive 10 or more minutes late.  We strive to give you quality time with our providers, and arriving late affects you and other patients whose appointments are after yours.  Also, if you no show three or more times for appointments you may be dismissed from the clinic at the providers discretion.     Again, thank you for choosing [facility].  Our hope is that these requests will decrease the amount of time that you wait before being seen by our physicians.       _____________________________________________________________  Should you have questions after your visit to  Cancer Center, please contact our office at [phone] between the hours of 8:30 a.m. and 4:30 p.m.  Voicemails left after 4:30 p.m. will not be returned until the following business day.  For prescription refill requests, have your pharmacy contact our office.       Resources For Cancer Patients and their Caregivers ? American Cancer Society: Can assist with transportation, wigs, general needs, runs Look Good Feel Better.        1-888-227-6333 ? Cancer Care: Provides financial assistance, online support groups, medication/co-pay assistance.  1-800-813-HOPE (4673) ? [facility] Assists [place] cancer patients and their families through  emotional , educational and financial support.  336-427-4357 ? [place] DSS Where to apply for food stamps, Medicaid and utility assistance. 336-342-1394 ? RCATS: Transportation to medical appointments. 336-347-2287 ? Social Security Administration: May apply for disability if have a Stage IV cancer. 336-342-7796 1-800-772-1213 ? [place] Aging, Disability and Transit Services: Assists with nutrition, care and transit needs. 336-349-2343  Cancer Center Support Programs: @10RELATIVEDAYS@ > Cancer Support Group  2nd Tuesday of the month 1pm-2pm, Journey Room  > Creative Journey  3rd Tuesday of the month 1130am-1pm, Journey Room  > Look Good Feel Better  1st Wednesday of the month 10am-12 noon, Journey Room (Call American Cancer Society to register 1-800-395-5775)    

## 2017-09-04 ENCOUNTER — Encounter (HOSPITAL_COMMUNITY): Payer: Self-pay

## 2017-09-06 ENCOUNTER — Encounter (HOSPITAL_COMMUNITY): Payer: Medicare HMO | Attending: Oncology

## 2017-09-06 ENCOUNTER — Encounter (HOSPITAL_COMMUNITY): Payer: Self-pay

## 2017-09-06 DIAGNOSIS — C8330 Diffuse large B-cell lymphoma, unspecified site: Secondary | ICD-10-CM | POA: Insufficient documentation

## 2017-09-06 DIAGNOSIS — Z23 Encounter for immunization: Secondary | ICD-10-CM | POA: Diagnosis not present

## 2017-09-06 MED ORDER — INFLUENZA VAC SPLIT QUAD 0.5 ML IM SUSY
0.5000 mL | PREFILLED_SYRINGE | Freq: Once | INTRAMUSCULAR | Status: AC
  Administered 2017-09-06: 0.5 mL via INTRAMUSCULAR
  Filled 2017-09-06: qty 0.5

## 2017-09-06 MED ORDER — HEPARIN SOD (PORK) LOCK FLUSH 100 UNIT/ML IV SOLN
500.0000 [IU] | Freq: Once | INTRAVENOUS | Status: AC
  Administered 2017-09-06: 500 [IU] via INTRAVENOUS
  Filled 2017-09-06: qty 5

## 2017-09-06 MED ORDER — SODIUM CHLORIDE 0.9% FLUSH
10.0000 mL | INTRAVENOUS | Status: DC | PRN
  Administered 2017-09-06: 10 mL via INTRAVENOUS
  Filled 2017-09-06: qty 10

## 2017-09-06 NOTE — Patient Instructions (Signed)
[place] at [facility] Discharge Instructions  RECOMMENDATIONS MADE BY THE CONSULTANT AND ANY TEST RESULTS WILL BE SENT TO YOUR REFERRING PHYSICIAN.  Port flush done  Flu shot given Follow up as scheduled.  Thank you for choosing [place] at [facility] to provide your oncology and hematology care.  To afford each patient quality time with our provider, please arrive at least 15 minutes before your scheduled appointment time.    If you have a lab appointment with the [facility] please come in thru the  Main Entrance and check in at the main information desk  You need to re-schedule your appointment should you arrive 10 or more minutes late.  We strive to give you quality time with our providers, and arriving late affects you and other patients whose appointments are after yours.  Also, if you no show three or more times for appointments you may be dismissed from the clinic at the providers discretion.     Again, thank you for choosing [facility].  Our hope is that these requests will decrease the amount of time that you wait before being seen by our physicians.       _____________________________________________________________  Should you have questions after your visit to [facility], please contact our office at [phone] between the hours of 8:30 a.m. and 4:30 p.m.  Voicemails left after 4:30 p.m. will not be returned until the following business day.  For prescription refill requests, have your pharmacy contact our office.       Resources For Cancer Patients and their Caregivers ? American Cancer Society: Can assist with transportation, wigs, general needs, runs Look Good Feel Better.        279-850-4685 ? Cancer Care: Provides financial assistance, online support groups, medication/co-pay assistance.  1-800-813-HOPE 870-693-5989) ? [place] Assists [place] cancer  patients and their families through emotional , educational and financial support.  573-325-9541 ? [place] DSS Where to apply for food stamps, Medicaid and utility assistance. 2085868533 ? RCATS: Transportation to medical appointments. 757-644-8578 ? Social Security Administration: May apply for disability if have a Stage IV cancer. 415-289-6676 469-162-4676 ? [employer], Disability and Transit Services: Assists with nutrition, care and transit needs. [place] Support Programs: @10RELATIVEDAYS @ > Cancer Support Group  2nd Tuesday of the month 1pm-2pm, Journey Room  > Creative Journey  3rd Tuesday of the month 1130am-1pm, Journey Room  > Look Good Feel Better  1st Wednesday of the month 10am-12 noon, Journey Room (Call [place] to register [phone])

## 2017-09-06 NOTE — Progress Notes (Signed)
Cherlynn June presented for Portacath access and flush. Portacath located leftchest wall accessed with  H 20 needle. Good blood return present. Portacath flushed with 90ml NS and 500U/80ml Heparin and needle removed intact. Procedure without incident. Patient tolerated procedure well.  Cherlynn June presents today for injection per MD orders. Fluarix administered IM in right Upper Arm. Administration without incident. Patient tolerated well.   Treatment given per orders. Patient tolerated it well without problems. Vitals stable and discharged home from clinic ambulatory. Follow up as scheduled.

## 2017-10-23 ENCOUNTER — Encounter (HOSPITAL_COMMUNITY): Payer: Self-pay

## 2017-11-07 DIAGNOSIS — I10 Essential (primary) hypertension: Secondary | ICD-10-CM | POA: Diagnosis not present

## 2017-11-07 DIAGNOSIS — C8589 Other specified types of non-Hodgkin lymphoma, extranodal and solid organ sites: Secondary | ICD-10-CM | POA: Diagnosis not present

## 2017-11-07 DIAGNOSIS — E782 Mixed hyperlipidemia: Secondary | ICD-10-CM | POA: Diagnosis not present

## 2017-11-07 DIAGNOSIS — E039 Hypothyroidism, unspecified: Secondary | ICD-10-CM | POA: Diagnosis not present

## 2017-11-07 DIAGNOSIS — M109 Gout, unspecified: Secondary | ICD-10-CM | POA: Diagnosis not present

## 2017-11-07 DIAGNOSIS — E875 Hyperkalemia: Secondary | ICD-10-CM | POA: Diagnosis not present

## 2017-11-07 DIAGNOSIS — E1121 Type 2 diabetes mellitus with diabetic nephropathy: Secondary | ICD-10-CM | POA: Diagnosis not present

## 2017-11-07 DIAGNOSIS — E1129 Type 2 diabetes mellitus with other diabetic kidney complication: Secondary | ICD-10-CM | POA: Diagnosis not present

## 2017-11-07 DIAGNOSIS — N183 Chronic kidney disease, stage 3 (moderate): Secondary | ICD-10-CM | POA: Diagnosis not present

## 2017-11-07 DIAGNOSIS — E7800 Pure hypercholesterolemia, unspecified: Secondary | ICD-10-CM | POA: Diagnosis not present

## 2017-11-09 DIAGNOSIS — E1129 Type 2 diabetes mellitus with other diabetic kidney complication: Secondary | ICD-10-CM | POA: Diagnosis not present

## 2017-11-09 DIAGNOSIS — Z6823 Body mass index (BMI) 23.0-23.9, adult: Secondary | ICD-10-CM | POA: Diagnosis not present

## 2017-11-09 DIAGNOSIS — M818 Other osteoporosis without current pathological fracture: Secondary | ICD-10-CM | POA: Diagnosis not present

## 2017-11-09 DIAGNOSIS — M109 Gout, unspecified: Secondary | ICD-10-CM | POA: Diagnosis not present

## 2017-11-09 DIAGNOSIS — K5901 Slow transit constipation: Secondary | ICD-10-CM | POA: Diagnosis not present

## 2017-11-09 DIAGNOSIS — C8589 Other specified types of non-Hodgkin lymphoma, extranodal and solid organ sites: Secondary | ICD-10-CM | POA: Diagnosis not present

## 2017-11-09 DIAGNOSIS — E782 Mixed hyperlipidemia: Secondary | ICD-10-CM | POA: Diagnosis not present

## 2017-11-09 DIAGNOSIS — N183 Chronic kidney disease, stage 3 (moderate): Secondary | ICD-10-CM | POA: Diagnosis not present

## 2017-11-09 DIAGNOSIS — E039 Hypothyroidism, unspecified: Secondary | ICD-10-CM | POA: Diagnosis not present

## 2017-11-09 DIAGNOSIS — I10 Essential (primary) hypertension: Secondary | ICD-10-CM | POA: Diagnosis not present

## 2017-12-04 ENCOUNTER — Inpatient Hospital Stay (HOSPITAL_COMMUNITY): Payer: Medicare HMO | Attending: Internal Medicine

## 2017-12-04 ENCOUNTER — Encounter (HOSPITAL_COMMUNITY): Payer: Self-pay

## 2017-12-04 DIAGNOSIS — Z452 Encounter for adjustment and management of vascular access device: Secondary | ICD-10-CM | POA: Insufficient documentation

## 2017-12-04 DIAGNOSIS — C8330 Diffuse large B-cell lymphoma, unspecified site: Secondary | ICD-10-CM | POA: Insufficient documentation

## 2017-12-04 MED ORDER — SODIUM CHLORIDE 0.9% FLUSH
10.0000 mL | INTRAVENOUS | Status: DC | PRN
  Administered 2017-12-04: 10 mL via INTRAVENOUS
  Filled 2017-12-04: qty 10

## 2017-12-04 MED ORDER — HEPARIN SOD (PORK) LOCK FLUSH 100 UNIT/ML IV SOLN
500.0000 [IU] | Freq: Once | INTRAVENOUS | Status: AC
  Administered 2017-12-04: 500 [IU] via INTRAVENOUS

## 2017-12-04 NOTE — Progress Notes (Signed)
Cristina Gregory tolerated portacath flush well without complaints or incident. Port accessed with 20 gauge needle with blood return noted then flushed with 10 ml NS and 5 ml Heparin easily per protocol then de-accessed. VSS Pt discharged self ambulatory in satisfactory condition

## 2017-12-04 NOTE — Patient Instructions (Signed)
[facility] at [facility] Discharge Instructions  RECOMMENDATIONS MADE BY THE CONSULTANT AND ANY TEST RESULTS WILL BE SENT TO YOUR REFERRING PHYSICIAN.  Portacath flushed per protocol today. Follow-up as scheduled. Call clinic for any questions or concerns  Thank you for choosing [facility] at [facility] to provide your oncology and hematology care.  To afford each patient quality time with our provider, please arrive at least 15 minutes before your scheduled appointment time.    If you have a lab appointment with the Cancer Center please come in thru the  Main Entrance and check in at the main information desk  You need to re-schedule your appointment should you arrive 10 or more minutes late.  We strive to give you quality time with our providers, and arriving late affects you and other patients whose appointments are after yours.  Also, if you no show three or more times for appointments you may be dismissed from the clinic at the providers discretion.     Again, thank you for choosing [facility].  Our hope is that these requests will decrease the amount of time that you wait before being seen by our physicians.       _____________________________________________________________  Should you have questions after your visit to [facility], please contact our office at [phone] between the hours of 8:30 a.m. and 4:30 p.m.  Voicemails left after 4:30 p.m. will not be returned until the following business day.  For prescription refill requests, have your pharmacy contact our office.       Resources For Cancer Patients and their Caregivers ? American Cancer Society: Can assist with transportation, wigs, general needs, runs Look Good Feel Better.        1-888-227-6333 ? Cancer Care: Provides financial assistance, online support groups, medication/co-pay assistance.  1-800-813-HOPE (4673) ? [facility] Assists [place] cancer patients and their families through emotional , educational and financial support.  336-427-4357 ? [place] DSS Where to apply for food stamps, Medicaid and utility assistance. 336-342-1394 ? RCATS: Transportation to medical appointments. 336-347-2287 ? Social Security Administration: May apply for disability if have a Stage IV cancer. 336-342-7796 1-800-772-1213 ? [place] Aging, Disability and Transit Services: Assists with nutrition, care and transit needs. 336-349-2343  Cancer Center Support Programs: @10RELATIVEDAYS@ > Cancer Support Group  2nd Tuesday of the month 1pm-2pm, Journey Room  > Creative Journey  3rd Tuesday of the month 1130am-1pm, Journey Room  > Look Good Feel Better  1st Wednesday of the month 10am-12 noon, Journey Room (Call American Cancer Society to register 1-800-395-5775)   

## 2017-12-26 DIAGNOSIS — I10 Essential (primary) hypertension: Secondary | ICD-10-CM | POA: Diagnosis not present

## 2017-12-26 DIAGNOSIS — R05 Cough: Secondary | ICD-10-CM | POA: Diagnosis not present

## 2017-12-26 DIAGNOSIS — Z7982 Long term (current) use of aspirin: Secondary | ICD-10-CM | POA: Diagnosis not present

## 2017-12-26 DIAGNOSIS — Z8673 Personal history of transient ischemic attack (TIA), and cerebral infarction without residual deficits: Secondary | ICD-10-CM | POA: Diagnosis not present

## 2017-12-26 DIAGNOSIS — Z7902 Long term (current) use of antithrombotics/antiplatelets: Secondary | ICD-10-CM | POA: Diagnosis not present

## 2017-12-26 DIAGNOSIS — E039 Hypothyroidism, unspecified: Secondary | ICD-10-CM | POA: Diagnosis not present

## 2017-12-26 DIAGNOSIS — J111 Influenza due to unidentified influenza virus with other respiratory manifestations: Secondary | ICD-10-CM | POA: Diagnosis not present

## 2017-12-26 DIAGNOSIS — Z79899 Other long term (current) drug therapy: Secondary | ICD-10-CM | POA: Diagnosis not present

## 2017-12-26 DIAGNOSIS — E7800 Pure hypercholesterolemia, unspecified: Secondary | ICD-10-CM | POA: Diagnosis not present

## 2018-01-08 ENCOUNTER — Inpatient Hospital Stay (HOSPITAL_COMMUNITY): Payer: Medicare HMO

## 2018-01-08 ENCOUNTER — Other Ambulatory Visit: Payer: Self-pay

## 2018-01-08 ENCOUNTER — Inpatient Hospital Stay (HOSPITAL_COMMUNITY): Payer: Medicare HMO | Attending: Internal Medicine | Admitting: Internal Medicine

## 2018-01-08 ENCOUNTER — Encounter (HOSPITAL_COMMUNITY): Payer: Self-pay | Admitting: Internal Medicine

## 2018-01-08 VITALS — BP 147/73 | HR 84 | Temp 98.1°F | Resp 20 | Wt 137.0 lb

## 2018-01-08 DIAGNOSIS — C8330 Diffuse large B-cell lymphoma, unspecified site: Secondary | ICD-10-CM | POA: Insufficient documentation

## 2018-01-08 DIAGNOSIS — E039 Hypothyroidism, unspecified: Secondary | ICD-10-CM

## 2018-01-08 DIAGNOSIS — I10 Essential (primary) hypertension: Secondary | ICD-10-CM | POA: Diagnosis not present

## 2018-01-08 LAB — CBC WITH DIFFERENTIAL/PLATELET
Basophils Absolute: 0 10*3/uL (ref 0.0–0.1)
Basophils Relative: 0 %
Eosinophils Absolute: 0.1 10*3/uL (ref 0.0–0.7)
Eosinophils Relative: 1 %
HEMATOCRIT: 38.6 % (ref 36.0–46.0)
HEMOGLOBIN: 12.7 g/dL (ref 12.0–15.0)
LYMPHS ABS: 2.2 10*3/uL (ref 0.7–4.0)
LYMPHS PCT: 21 %
MCH: 29.7 pg (ref 26.0–34.0)
MCHC: 32.9 g/dL (ref 30.0–36.0)
MCV: 90.4 fL (ref 78.0–100.0)
MONOS PCT: 9 %
Monocytes Absolute: 0.9 10*3/uL (ref 0.1–1.0)
NEUTROS ABS: 7.1 10*3/uL (ref 1.7–7.7)
NEUTROS PCT: 69 %
Platelets: 357 10*3/uL (ref 150–400)
RBC: 4.27 MIL/uL (ref 3.87–5.11)
RDW: 14.6 % (ref 11.5–15.5)
WBC: 10.3 10*3/uL (ref 4.0–10.5)

## 2018-01-08 LAB — COMPREHENSIVE METABOLIC PANEL
ALBUMIN: 3.5 g/dL (ref 3.5–5.0)
ALT: 49 U/L (ref 14–54)
ANION GAP: 14 (ref 5–15)
AST: 39 U/L (ref 15–41)
Alkaline Phosphatase: 134 U/L — ABNORMAL HIGH (ref 38–126)
BUN: 31 mg/dL — ABNORMAL HIGH (ref 6–20)
CHLORIDE: 100 mmol/L — AB (ref 101–111)
CO2: 22 mmol/L (ref 22–32)
Calcium: 9.5 mg/dL (ref 8.9–10.3)
Creatinine, Ser: 1.44 mg/dL — ABNORMAL HIGH (ref 0.44–1.00)
GFR calc non Af Amer: 33 mL/min — ABNORMAL LOW (ref >60)
GFR, EST AFRICAN AMERICAN: 39 mL/min — AB (ref >60)
GLUCOSE: 204 mg/dL — AB (ref 65–99)
Potassium: 4 mmol/L (ref 3.5–5.1)
SODIUM: 136 mmol/L (ref 135–145)
Total Bilirubin: 0.6 mg/dL (ref 0.3–1.2)
Total Protein: 7.8 g/dL (ref 6.5–8.1)

## 2018-01-08 LAB — LACTATE DEHYDROGENASE: LDH: 110 U/L (ref 98–192)

## 2018-01-08 MED ORDER — SODIUM CHLORIDE 0.9% FLUSH
10.0000 mL | Freq: Once | INTRAVENOUS | Status: AC
  Administered 2018-01-08: 10 mL via INTRAVENOUS

## 2018-01-08 MED ORDER — HEPARIN SOD (PORK) LOCK FLUSH 100 UNIT/ML IV SOLN
500.0000 [IU] | Freq: Once | INTRAVENOUS | Status: AC
  Administered 2018-01-08: 500 [IU] via INTRAVENOUS

## 2018-01-08 NOTE — Patient Instructions (Addendum)
[place] at [facility] Discharge Instructions  You were seen today by Dr. Walden Field. She discussed how you've been feeling. She also discussed your blood work and everything looks good. We will see you back in 6 months for labs and follow up.    Thank you for choosing [place] at [facility] to provide your oncology and hematology care.  To afford each patient quality time with our provider, please arrive at least 15 minutes before your scheduled appointment time.   If you have a lab appointment with the [facility] please come in thru the  Main Entrance and check in at the main information desk  You need to re-schedule your appointment should you arrive 10 or more minutes late.  We strive to give you quality time with our providers, and arriving late affects you and other patients whose appointments are after yours.  Also, if you no show three or more times for appointments you may be dismissed from the clinic at the providers discretion.     Again, thank you for choosing [facility]  Our hope is that these requests will decrease the amount of time that you wait before being seen by our physicians.       _____________________________________________________________  Should you have questions after your visit to [facility], please contact our office at [phone] between the hours of 8:30 a.m. and 4:30 p.m.  Voicemails left after 4:30 p.m. will not be returned until the following business day.  For prescription refill requests, have your pharmacy contact our office.       Resources For Cancer Patients and their Caregivers ? American Cancer Society: Can assist with transportation, wigs, general needs, runs Look Good Feel Better.        804-823-0707 ? Cancer Care: Provides financial assistance, online support groups, medication/co-pay assistance.  1-800-813-HOPE 814-275-1011) ? [place] Assists [place] cancer patients and their families through emotional , educational and financial support.  920-262-5111 ? [place] DSS Where to apply for food stamps, Medicaid and utility assistance. 367-558-2800 ? RCATS: Transportation to medical appointments. (212) 836-3014 ? Social Security Administration: May apply for disability if have a Stage IV cancer. (815) 631-6232 (812) 473-6847 ? [employer], Disability and Transit Services: Assists with nutrition, care and transit needs. [place] Support Programs:   > Cancer Support Group  2nd Tuesday of the month 1pm-2pm, Journey Room   > Creative Journey  3rd Tuesday of the month 1130am-1pm, Journey Room

## 2018-01-08 NOTE — Patient Instructions (Signed)
[facility] at [facility]  Discharge Instructions:  Your port was flushed today. _______________________________________________________________  Thank you for choosing [facility] at [facility] to provide your oncology and hematology care.  To afford each patient quality time with our providers, please arrive at least 15 minutes before your scheduled appointment.  You need to re-schedule your appointment if you arrive 10 or more minutes late.  We strive to give you quality time with our providers, and arriving late affects you and other patients whose appointments are after yours.  Also, if you no show three or more times for appointments you may be dismissed from the clinic.  Again, thank you for choosing [facility] at [facility]. Our hope is that these requests will allow you access to exceptional care and in a timely manner. _______________________________________________________________  If you have questions after your visit, please contact our office at [phone] between the hours of 8:30 a.m. and 5:00 p.m. Voicemails left after 4:30 p.m. will not be returned until the following business day. _______________________________________________________________  For prescription refill requests, have your pharmacy contact our office. _______________________________________________________________  Recommendations made by the consultant and any test results will be sent to your referring physician. _______________________________________________________________ 

## 2018-01-08 NOTE — Progress Notes (Signed)
Patients port flushed per protocol.  No complaints of pain with flush.  No bruising or swelling noted at site.  VSS with discharge and left ambulatory with family with no s/s of distress noted.

## 2018-02-07 NOTE — Progress Notes (Signed)
Diagnosis Diffuse large B-cell lymphoma, unspecified body region ([place]) - Plan: CBC with Differential/Platelet, Comprehensive metabolic panel, Lactate dehydrogenase  Staging Cancer Staging DLBCL (diffuse large B cell lymphoma) ([place]) Staging form: Lymphoid Neoplasms, AJCC 6th Edition - Clinical stage from 05/26/2015: Stage II - Signed by B[facility] Cancer, PA-C on 07/13/2016   Assessment and Plan: 1.  DLBCL.  Pt was followed by Dr. Talbert Cage.  Last scans done 2017 showed no adenopathy.  She remains on observation. Labs done 01/08/2018 WNL.  She will RTC 06/2018 for follow-up and repeat labs.    2.  HTN.   BP 147/73.  Follow-up with PCP.    3.  Hypothyroidism.  Pt on synthroid.  Follow-up with PCP for monitoring.   Interval history:   From Kirby Crigler, PA-C's last note on 07/13/16)    Current Status:  Pt is seen today for follow-up.  She denies B symptoms.      DLBCL (diffuse large B cell lymphoma) ([place])   05/06/2015 Initial Diagnosis    DLBCL (diffuse large B cell lymphoma) ([place]).  Diagnosis via right supraclavicular/cervical lymph node biopsy.      05/26/2015 Cancer Staging    Stage IIB      05/26/2015 PET scan    Hypermetabolic right supraclavicular adenopathy. Mild hypermetabolic AP window lymph node. A focal hypermetabolic wall thickening in the mid sigmoid colon, correlate for acute diverticulitis versus mass. No hypermetabolic right lung base nodule, likely too small for evaluation by PET      05/28/2015 Echocardiogram    Left ventricle is normal in size. Mild concentric left ventricular hypertrophy. Left ventricle ejection fraction is normal (60-65 percent). Left ventricular diastolic function is abnormal. There is mild aortic regurgitation.      06/04/2015 -  Chemotherapy    RCHOP, administered by Dr. Tressie Stalker at [facility].  Dose reduction beginning on cycle two.  Due to complications, received 4 cycles of chemotherapy. Her last 2 cycles cancelled.      07/06/2015 Adverse Reaction   Hospitalized with neutropenic fever but negative blood cultures      08/03/2015 Remission    PET scan shows complete remission after 3 cycles of chemotherapy      08/03/2015 PET scan    A near complete resolution of right supraclavicular nodes. No hypermetabolic activity remaining. Stable activity within the left thyroid lobe. Ultrasound for further evaluation if clinically indicated. Stable 3 mm right basilar nodule. Nonspecific activity in the sigmoid colon is increased.      08/20/2015 Adverse Reaction    Hospitalized after cycle 4 with neutropenic fever, thrombocytopenia, and Staphylococcus epidermidis blood culture positive 1      09/11/2015 - 10/30/2015 Radiation Therapy    36 Gy delivered to areas of previous involvement due to inability to tolerate further chemotherapy.        Problem List Patient Active Problem List   Diagnosis Date Noted  . Abnormal PET scan of colon [R94.8] 02/07/2017  . DLBCL (diffuse large B cell lymphoma) ([place]) [C83.30] 07/13/2016    Past Medical History Past Medical History:  Diagnosis Date  . Chronic renal insufficiency   . DLBCL (diffuse large B cell lymphoma) ([place]) 07/13/2016  . Gout   . Hypertension   . Hypothyroidism     Past Surgical History Past Surgical History:  Procedure Laterality Date  . COLONOSCOPY N/A 03/06/2017   Procedure: COLONOSCOPY;  Surgeon: Danie Binder, MD;  Location: [facility] ENDO SUITE;  Service: Endoscopy;  Laterality: N/A;  10:00am  . Port-A-Cath placement    .  SLT LASER APPLICATION Right 2/95/2841   Procedure: SLT LASER APPLICATION;  Surgeon: Williams Che, MD;  Location: [facility] ORS;  Service: Ophthalmology;  Laterality: Right;    Family History Family History  Problem Relation Age of Onset  . Colon cancer Mother        Patient states her mother has been: Issues before it could be evaluated she died from heart disease.     Social History  reports that she has never smoked. She has never used smokeless  tobacco. She reports that she does not drink alcohol or use drugs.  Medications  Current Outpatient Medications:  .  allopurinol (ZYLOPRIM) 300 MG tablet, Take 300 mg by mouth daily at 12 noon. , Disp: , Rfl:  .  amLODipine (NORVASC) 5 MG tablet, Take 5 mg by mouth daily at 12 noon. , Disp: , Rfl:  .  aspirin 81 MG EC tablet, Take 81 mg by mouth daily at 12 noon. , Disp: , Rfl:  .  clopidogrel (PLAVIX) 75 MG tablet, Take 75 mg by mouth daily at 12 noon. , Disp: , Rfl:  .  hydrochlorothiazide (HYDRODIURIL) 12.5 MG tablet, Take 12.5 mg by mouth daily at 12 noon., Disp: , Rfl:  .  levothyroxine (SYNTHROID, LEVOTHROID) 88 MCG tablet, Take 88 mcg by mouth daily before breakfast., Disp: , Rfl:  .  lidocaine-prilocaine (EMLA) cream, Apply 1 application topically as needed (port access)., Disp: , Rfl:  .  metoprolol tartrate (LOPRESSOR) 25 MG tablet, Take 25 mg by mouth 2 (two) times daily., Disp: , Rfl:  .  polyethylene glycol (MIRALAX / GLYCOLAX) packet, Take 17 g by mouth daily., Disp: , Rfl:  .  simvastatin (ZOCOR) 20 MG tablet, Take 20 mg by mouth at bedtime. , Disp: , Rfl:   Allergies Patient has no known allergies.  Review of Systems Review of Systems - Oncology ROS as per HPI otherwise 12 point ROS is negative.   Physical Exam  Vitals Wt Readings from Last 3 Encounters:  01/08/18 137 lb (62.1 kg)  07/10/17 135 lb 3.2 oz (61.3 kg)  03/06/17 135 lb (61.2 kg)   Temp Readings from Last 3 Encounters:  01/08/18 98.1 F (36.7 C) (Oral)  12/04/17 97.9 F (36.6 C) (Oral)  09/06/17 98.4 F (36.9 C) (Oral)   BP Readings from Last 3 Encounters:  01/08/18 (!) 147/73  12/04/17 (!) 155/60  09/06/17 (!) 148/57   Pulse Readings from Last 3 Encounters:  01/08/18 84  12/04/17 62  09/06/17 73   Constitutional: Well-developed, well-nourished, and in no distress.   HENT: Head: Normocephalic and atraumatic.  Mouth/Throat: No oropharyngeal exudate. Mucosa moist. Eyes: Pupils are equal,  round, and reactive to light. Conjunctivae are normal. No scleral icterus.  Neck: Normal range of motion. Neck supple. No JVD present.  Cardiovascular: Normal rate, regular rhythm and normal heart sounds.  Exam reveals no gallop and no friction rub.   No murmur heard. Pulmonary/Chest: Effort normal and breath sounds normal. No respiratory distress. No wheezes.No rales.  Abdominal: Soft. Bowel sounds are normal. No distension. There is no tenderness. There is no guarding.  Musculoskeletal: No edema or tenderness.  Lymphadenopathy: No cervical, axillary  or supraclavicular adenopathy.  Neurological: Alert and oriented to person, place, and time. No cranial nerve deficit.  Skin: Skin is warm and dry. No rash noted. No erythema. No pallor.  Psychiatric: Affect and judgment normal.   Labs Infusion on 01/08/2018  Component Date Value Ref Range Status  . LDH 01/08/2018  110  98 - 192 U/L Final   Performed at [facility], [address], [place], [place] [address]  . Sodium 01/08/2018 136  135 - 145 mmol/L Final  . Potassium 01/08/2018 4.0  3.5 - 5.1 mmol/L Final  . Chloride 01/08/2018 100* 101 - 111 mmol/L Final  . CO2 01/08/2018 22  22 - 32 mmol/L Final  . Glucose, Bld 01/08/2018 204* 65 - 99 mg/dL Final  . BUN 01/08/2018 31* 6 - 20 mg/dL Final  . Creatinine, Ser 01/08/2018 1.44* 0.44 - 1.00 mg/dL Final  . Calcium 01/08/2018 9.5  8.9 - 10.3 mg/dL Final  . Total Protein 01/08/2018 7.8  6.5 - 8.1 g/dL Final  . Albumin 01/08/2018 3.5  3.5 - 5.0 g/dL Final  . AST 01/08/2018 39  15 - 41 U/L Final  . ALT 01/08/2018 49  14 - 54 U/L Final  . Alkaline Phosphatase 01/08/2018 134* 38 - 126 U/L Final  . Total Bilirubin 01/08/2018 0.6  0.3 - 1.2 mg/dL Final  . GFR calc non Af Amer 01/08/2018 33* >60 mL/min Final  . GFR calc Af Amer 01/08/2018 39* >60 mL/min Final   Comment: (NOTE) The eGFR has been calculated using the CKD EPI equation. This calculation has not been validated in all clinical  situations. eGFR's persistently <60 mL/min signify possible Chronic Kidney Disease.   Georgiann Hahn gap 01/08/2018 14  5 - 15 Final   Performed at [facility], [address] [place], [place] [address]  . WBC 01/08/2018 10.3  4.0 - 10.5 K/uL Final  . RBC 01/08/2018 4.27  3.87 - 5.11 MIL/uL Final  . Hemoglobin 01/08/2018 12.7  12.0 - 15.0 g/dL Final  . HCT 01/08/2018 38.6  36.0 - 46.0 % Final  . MCV 01/08/2018 90.4  78.0 - 100.0 fL Final  . MCH 01/08/2018 29.7  26.0 - 34.0 pg Final  . MCHC 01/08/2018 32.9  30.0 - 36.0 g/dL Final  . RDW 01/08/2018 14.6  11.5 - 15.5 % Final  . Platelets 01/08/2018 357  150 - 400 K/uL Final  . Neutrophils Relative % 01/08/2018 69  % Final  . Neutro Abs 01/08/2018 7.1  1.7 - 7.7 K/uL Final  . Lymphocytes Relative 01/08/2018 21  % Final  . Lymphs Abs 01/08/2018 2.2  0.7 - 4.0 K/uL Final  . Monocytes Relative 01/08/2018 9  % Final  . Monocytes Absolute 01/08/2018 0.9  0.1 - 1.0 K/uL Final  . Eosinophils Relative 01/08/2018 1  % Final  . Eosinophils Absolute 01/08/2018 0.1  0.0 - 0.7 K/uL Final  . Basophils Relative 01/08/2018 0  % Final  . Basophils Absolute 01/08/2018 0.0  0.0 - 0.1 K/uL Final   Performed at [facility], [address] [place], [place] [address]     Pathology Orders Placed This Encounter  Procedures  . CBC with Differential/Platelet    Standing Status:   Future    Standing Expiration Date:   01/09/2019  . Comprehensive metabolic panel    Standing Status:   Future    Standing Expiration Date:   01/09/2019  . Lactate dehydrogenase    Standing Status:   Future    Standing Expiration Date:   01/09/2019       Zoila Shutter MD

## 2018-03-01 DIAGNOSIS — E782 Mixed hyperlipidemia: Secondary | ICD-10-CM | POA: Diagnosis not present

## 2018-03-01 DIAGNOSIS — E1129 Type 2 diabetes mellitus with other diabetic kidney complication: Secondary | ICD-10-CM | POA: Diagnosis not present

## 2018-03-01 DIAGNOSIS — E1121 Type 2 diabetes mellitus with diabetic nephropathy: Secondary | ICD-10-CM | POA: Diagnosis not present

## 2018-03-01 DIAGNOSIS — E7800 Pure hypercholesterolemia, unspecified: Secondary | ICD-10-CM | POA: Diagnosis not present

## 2018-03-01 DIAGNOSIS — E039 Hypothyroidism, unspecified: Secondary | ICD-10-CM | POA: Diagnosis not present

## 2018-03-01 DIAGNOSIS — N183 Chronic kidney disease, stage 3 (moderate): Secondary | ICD-10-CM | POA: Diagnosis not present

## 2018-03-01 DIAGNOSIS — M10079 Idiopathic gout, unspecified ankle and foot: Secondary | ICD-10-CM | POA: Diagnosis not present

## 2018-03-01 DIAGNOSIS — E875 Hyperkalemia: Secondary | ICD-10-CM | POA: Diagnosis not present

## 2018-03-06 DIAGNOSIS — E039 Hypothyroidism, unspecified: Secondary | ICD-10-CM | POA: Diagnosis not present

## 2018-03-06 DIAGNOSIS — M109 Gout, unspecified: Secondary | ICD-10-CM | POA: Diagnosis not present

## 2018-03-06 DIAGNOSIS — E1129 Type 2 diabetes mellitus with other diabetic kidney complication: Secondary | ICD-10-CM | POA: Diagnosis not present

## 2018-03-06 DIAGNOSIS — Z0001 Encounter for general adult medical examination with abnormal findings: Secondary | ICD-10-CM | POA: Diagnosis not present

## 2018-03-06 DIAGNOSIS — I10 Essential (primary) hypertension: Secondary | ICD-10-CM | POA: Diagnosis not present

## 2018-03-06 DIAGNOSIS — N183 Chronic kidney disease, stage 3 (moderate): Secondary | ICD-10-CM | POA: Diagnosis not present

## 2018-03-06 DIAGNOSIS — K5901 Slow transit constipation: Secondary | ICD-10-CM | POA: Diagnosis not present

## 2018-03-06 DIAGNOSIS — M818 Other osteoporosis without current pathological fracture: Secondary | ICD-10-CM | POA: Diagnosis not present

## 2018-03-06 DIAGNOSIS — C8589 Other specified types of non-Hodgkin lymphoma, extranodal and solid organ sites: Secondary | ICD-10-CM | POA: Diagnosis not present

## 2018-03-06 DIAGNOSIS — E782 Mixed hyperlipidemia: Secondary | ICD-10-CM | POA: Diagnosis not present

## 2018-03-08 DIAGNOSIS — H269 Unspecified cataract: Secondary | ICD-10-CM | POA: Diagnosis not present

## 2018-03-08 DIAGNOSIS — Z7982 Long term (current) use of aspirin: Secondary | ICD-10-CM | POA: Diagnosis not present

## 2018-03-08 DIAGNOSIS — Z7902 Long term (current) use of antithrombotics/antiplatelets: Secondary | ICD-10-CM | POA: Diagnosis not present

## 2018-03-08 DIAGNOSIS — K5900 Constipation, unspecified: Secondary | ICD-10-CM | POA: Diagnosis not present

## 2018-03-08 DIAGNOSIS — E039 Hypothyroidism, unspecified: Secondary | ICD-10-CM | POA: Diagnosis not present

## 2018-03-08 DIAGNOSIS — M109 Gout, unspecified: Secondary | ICD-10-CM | POA: Diagnosis not present

## 2018-03-08 DIAGNOSIS — E785 Hyperlipidemia, unspecified: Secondary | ICD-10-CM | POA: Diagnosis not present

## 2018-03-08 DIAGNOSIS — C8590 Non-Hodgkin lymphoma, unspecified, unspecified site: Secondary | ICD-10-CM | POA: Diagnosis not present

## 2018-03-08 DIAGNOSIS — I10 Essential (primary) hypertension: Secondary | ICD-10-CM | POA: Diagnosis not present

## 2018-03-08 DIAGNOSIS — Z809 Family history of malignant neoplasm, unspecified: Secondary | ICD-10-CM | POA: Diagnosis not present

## 2018-03-09 ENCOUNTER — Encounter (HOSPITAL_COMMUNITY): Payer: Self-pay

## 2018-03-17 DIAGNOSIS — E039 Hypothyroidism, unspecified: Secondary | ICD-10-CM | POA: Diagnosis not present

## 2018-03-17 DIAGNOSIS — E119 Type 2 diabetes mellitus without complications: Secondary | ICD-10-CM | POA: Diagnosis not present

## 2018-03-17 DIAGNOSIS — I6789 Other cerebrovascular disease: Secondary | ICD-10-CM | POA: Diagnosis not present

## 2018-03-17 DIAGNOSIS — E1165 Type 2 diabetes mellitus with hyperglycemia: Secondary | ICD-10-CM | POA: Diagnosis not present

## 2018-03-17 DIAGNOSIS — R531 Weakness: Secondary | ICD-10-CM | POA: Diagnosis not present

## 2018-03-17 DIAGNOSIS — N189 Chronic kidney disease, unspecified: Secondary | ICD-10-CM | POA: Diagnosis not present

## 2018-03-17 DIAGNOSIS — I129 Hypertensive chronic kidney disease with stage 1 through stage 4 chronic kidney disease, or unspecified chronic kidney disease: Secondary | ICD-10-CM | POA: Diagnosis not present

## 2018-03-17 DIAGNOSIS — I10 Essential (primary) hypertension: Secondary | ICD-10-CM | POA: Diagnosis not present

## 2018-03-17 DIAGNOSIS — E860 Dehydration: Secondary | ICD-10-CM | POA: Diagnosis not present

## 2018-03-17 DIAGNOSIS — E1122 Type 2 diabetes mellitus with diabetic chronic kidney disease: Secondary | ICD-10-CM | POA: Diagnosis not present

## 2018-03-17 DIAGNOSIS — N183 Chronic kidney disease, stage 3 (moderate): Secondary | ICD-10-CM | POA: Diagnosis not present

## 2018-03-17 DIAGNOSIS — C8590 Non-Hodgkin lymphoma, unspecified, unspecified site: Secondary | ICD-10-CM | POA: Diagnosis not present

## 2018-03-17 DIAGNOSIS — E785 Hyperlipidemia, unspecified: Secondary | ICD-10-CM | POA: Diagnosis not present

## 2018-03-17 DIAGNOSIS — G459 Transient cerebral ischemic attack, unspecified: Secondary | ICD-10-CM | POA: Diagnosis not present

## 2018-03-17 DIAGNOSIS — R202 Paresthesia of skin: Secondary | ICD-10-CM | POA: Diagnosis not present

## 2018-03-18 DIAGNOSIS — N183 Chronic kidney disease, stage 3 (moderate): Secondary | ICD-10-CM | POA: Diagnosis not present

## 2018-03-18 DIAGNOSIS — I10 Essential (primary) hypertension: Secondary | ICD-10-CM | POA: Diagnosis not present

## 2018-03-18 DIAGNOSIS — C8590 Non-Hodgkin lymphoma, unspecified, unspecified site: Secondary | ICD-10-CM | POA: Diagnosis not present

## 2018-03-18 DIAGNOSIS — I129 Hypertensive chronic kidney disease with stage 1 through stage 4 chronic kidney disease, or unspecified chronic kidney disease: Secondary | ICD-10-CM | POA: Diagnosis not present

## 2018-03-18 DIAGNOSIS — E860 Dehydration: Secondary | ICD-10-CM | POA: Diagnosis not present

## 2018-03-18 DIAGNOSIS — E1122 Type 2 diabetes mellitus with diabetic chronic kidney disease: Secondary | ICD-10-CM | POA: Diagnosis not present

## 2018-03-18 DIAGNOSIS — G459 Transient cerebral ischemic attack, unspecified: Secondary | ICD-10-CM | POA: Diagnosis not present

## 2018-03-18 DIAGNOSIS — E119 Type 2 diabetes mellitus without complications: Secondary | ICD-10-CM | POA: Diagnosis not present

## 2018-03-18 DIAGNOSIS — E785 Hyperlipidemia, unspecified: Secondary | ICD-10-CM | POA: Diagnosis not present

## 2018-03-18 DIAGNOSIS — E039 Hypothyroidism, unspecified: Secondary | ICD-10-CM | POA: Diagnosis not present

## 2018-03-18 DIAGNOSIS — E1165 Type 2 diabetes mellitus with hyperglycemia: Secondary | ICD-10-CM | POA: Diagnosis not present

## 2018-03-18 DIAGNOSIS — R202 Paresthesia of skin: Secondary | ICD-10-CM | POA: Diagnosis not present

## 2018-03-19 DIAGNOSIS — E785 Hyperlipidemia, unspecified: Secondary | ICD-10-CM | POA: Diagnosis not present

## 2018-03-19 DIAGNOSIS — I10 Essential (primary) hypertension: Secondary | ICD-10-CM | POA: Diagnosis not present

## 2018-03-19 DIAGNOSIS — E119 Type 2 diabetes mellitus without complications: Secondary | ICD-10-CM | POA: Diagnosis not present

## 2018-03-19 DIAGNOSIS — R202 Paresthesia of skin: Secondary | ICD-10-CM | POA: Diagnosis not present

## 2018-04-02 ENCOUNTER — Inpatient Hospital Stay (HOSPITAL_COMMUNITY): Payer: Medicare HMO | Attending: Hematology

## 2018-04-02 VITALS — BP 183/74 | HR 73 | Temp 98.0°F | Resp 18

## 2018-04-02 DIAGNOSIS — Z452 Encounter for adjustment and management of vascular access device: Secondary | ICD-10-CM | POA: Insufficient documentation

## 2018-04-02 DIAGNOSIS — C8330 Diffuse large B-cell lymphoma, unspecified site: Secondary | ICD-10-CM

## 2018-04-02 MED ORDER — SODIUM CHLORIDE 0.9% FLUSH
10.0000 mL | Freq: Once | INTRAVENOUS | Status: AC
  Administered 2018-04-02: 10 mL via INTRAVENOUS

## 2018-04-02 MED ORDER — HEPARIN SOD (PORK) LOCK FLUSH 100 UNIT/ML IV SOLN
500.0000 [IU] | Freq: Once | INTRAVENOUS | Status: AC
  Administered 2018-04-02: 500 [IU] via INTRAVENOUS
  Filled 2018-04-02: qty 5

## 2018-04-02 NOTE — Patient Instructions (Signed)
[facility] at [facility]  Discharge Instructions:  Your port was flushed today. _______________________________________________________________  Thank you for choosing [facility] at [facility] to provide your oncology and hematology care.  To afford each patient quality time with our providers, please arrive at least 15 minutes before your scheduled appointment.  You need to re-schedule your appointment if you arrive 10 or more minutes late.  We strive to give you quality time with our providers, and arriving late affects you and other patients whose appointments are after yours.  Also, if you no show three or more times for appointments you may be dismissed from the clinic.  Again, thank you for choosing [facility] at [facility]. Our hope is that these requests will allow you access to exceptional care and in a timely manner. _______________________________________________________________  If you have questions after your visit, please contact our office at [phone] between the hours of 8:30 a.m. and 5:00 p.m. Voicemails left after 4:30 p.m. will not be returned until the following business day. _______________________________________________________________  For prescription refill requests, have your pharmacy contact our office. _______________________________________________________________  Recommendations made by the consultant and any test results will be sent to your referring physician. _______________________________________________________________ 

## 2018-04-02 NOTE — Progress Notes (Signed)
Port flushed per protocol.  Site clean and dry with no bruising or swelling noted.  No blood return noted but flushed easily with no complaints of pain with flush. Band aid applied.  VSs with discharge and left ambulatory with no s/s of distress noted.

## 2018-04-18 DIAGNOSIS — E1129 Type 2 diabetes mellitus with other diabetic kidney complication: Secondary | ICD-10-CM | POA: Diagnosis not present

## 2018-04-18 DIAGNOSIS — Z6822 Body mass index (BMI) 22.0-22.9, adult: Secondary | ICD-10-CM | POA: Diagnosis not present

## 2018-04-18 DIAGNOSIS — I10 Essential (primary) hypertension: Secondary | ICD-10-CM | POA: Diagnosis not present

## 2018-04-18 DIAGNOSIS — G459 Transient cerebral ischemic attack, unspecified: Secondary | ICD-10-CM | POA: Diagnosis not present

## 2018-04-18 DIAGNOSIS — E782 Mixed hyperlipidemia: Secondary | ICD-10-CM | POA: Diagnosis not present

## 2018-05-09 ENCOUNTER — Encounter (HOSPITAL_COMMUNITY): Payer: Self-pay

## 2018-07-02 DIAGNOSIS — I10 Essential (primary) hypertension: Secondary | ICD-10-CM | POA: Diagnosis not present

## 2018-07-02 DIAGNOSIS — E875 Hyperkalemia: Secondary | ICD-10-CM | POA: Diagnosis not present

## 2018-07-02 DIAGNOSIS — E039 Hypothyroidism, unspecified: Secondary | ICD-10-CM | POA: Diagnosis not present

## 2018-07-02 DIAGNOSIS — E7800 Pure hypercholesterolemia, unspecified: Secondary | ICD-10-CM | POA: Diagnosis not present

## 2018-07-02 DIAGNOSIS — E1121 Type 2 diabetes mellitus with diabetic nephropathy: Secondary | ICD-10-CM | POA: Diagnosis not present

## 2018-07-02 DIAGNOSIS — E782 Mixed hyperlipidemia: Secondary | ICD-10-CM | POA: Diagnosis not present

## 2018-07-02 DIAGNOSIS — N183 Chronic kidney disease, stage 3 (moderate): Secondary | ICD-10-CM | POA: Diagnosis not present

## 2018-07-05 ENCOUNTER — Inpatient Hospital Stay (HOSPITAL_COMMUNITY): Payer: Medicare HMO | Attending: Hematology

## 2018-07-05 ENCOUNTER — Encounter (HOSPITAL_COMMUNITY): Payer: Self-pay

## 2018-07-05 ENCOUNTER — Other Ambulatory Visit: Payer: Self-pay

## 2018-07-05 DIAGNOSIS — Z7982 Long term (current) use of aspirin: Secondary | ICD-10-CM | POA: Diagnosis not present

## 2018-07-05 DIAGNOSIS — I129 Hypertensive chronic kidney disease with stage 1 through stage 4 chronic kidney disease, or unspecified chronic kidney disease: Secondary | ICD-10-CM | POA: Diagnosis not present

## 2018-07-05 DIAGNOSIS — Z79899 Other long term (current) drug therapy: Secondary | ICD-10-CM | POA: Diagnosis not present

## 2018-07-05 DIAGNOSIS — E039 Hypothyroidism, unspecified: Secondary | ICD-10-CM | POA: Insufficient documentation

## 2018-07-05 DIAGNOSIS — N189 Chronic kidney disease, unspecified: Secondary | ICD-10-CM | POA: Diagnosis not present

## 2018-07-05 DIAGNOSIS — C8330 Diffuse large B-cell lymphoma, unspecified site: Secondary | ICD-10-CM | POA: Diagnosis present

## 2018-07-05 LAB — COMPREHENSIVE METABOLIC PANEL
ALT: 34 U/L (ref 0–44)
AST: 31 U/L (ref 15–41)
Albumin: 4.3 g/dL (ref 3.5–5.0)
Alkaline Phosphatase: 96 U/L (ref 38–126)
Anion gap: 6 (ref 5–15)
BUN: 31 mg/dL — AB (ref 8–23)
CHLORIDE: 103 mmol/L (ref 98–111)
CO2: 30 mmol/L (ref 22–32)
Calcium: 9.4 mg/dL (ref 8.9–10.3)
Creatinine, Ser: 1.48 mg/dL — ABNORMAL HIGH (ref 0.44–1.00)
GFR calc Af Amer: 37 mL/min — ABNORMAL LOW (ref >60)
GFR, EST NON AFRICAN AMERICAN: 32 mL/min — AB (ref >60)
Glucose, Bld: 114 mg/dL — ABNORMAL HIGH (ref 70–99)
POTASSIUM: 4.2 mmol/L (ref 3.5–5.1)
Sodium: 139 mmol/L (ref 135–145)
Total Bilirubin: 0.6 mg/dL (ref 0.3–1.2)
Total Protein: 7.9 g/dL (ref 6.5–8.1)

## 2018-07-05 LAB — CBC WITH DIFFERENTIAL/PLATELET
Basophils Absolute: 0 10*3/uL (ref 0.0–0.1)
Basophils Relative: 1 %
EOS PCT: 2 %
Eosinophils Absolute: 0.2 10*3/uL (ref 0.0–0.7)
HEMATOCRIT: 39.2 % (ref 36.0–46.0)
Hemoglobin: 12.9 g/dL (ref 12.0–15.0)
LYMPHS ABS: 2.3 10*3/uL (ref 0.7–4.0)
LYMPHS PCT: 29 %
MCH: 30.4 pg (ref 26.0–34.0)
MCHC: 32.9 g/dL (ref 30.0–36.0)
MCV: 92.2 fL (ref 78.0–100.0)
Monocytes Absolute: 0.8 10*3/uL (ref 0.1–1.0)
Monocytes Relative: 10 %
Neutro Abs: 4.6 10*3/uL (ref 1.7–7.7)
Neutrophils Relative %: 58 %
Platelets: 200 10*3/uL (ref 150–400)
RBC: 4.25 MIL/uL (ref 3.87–5.11)
RDW: 15.4 % (ref 11.5–15.5)
WBC: 8 10*3/uL (ref 4.0–10.5)

## 2018-07-05 LAB — LACTATE DEHYDROGENASE: LDH: 140 U/L (ref 98–192)

## 2018-07-05 MED ORDER — SODIUM CHLORIDE 0.9% FLUSH
10.0000 mL | INTRAVENOUS | Status: DC | PRN
  Administered 2018-07-05: 10 mL via INTRAVENOUS
  Filled 2018-07-05: qty 10

## 2018-07-05 MED ORDER — HEPARIN SOD (PORK) LOCK FLUSH 100 UNIT/ML IV SOLN
500.0000 [IU] | Freq: Once | INTRAVENOUS | Status: AC
  Administered 2018-07-05: 500 [IU] via INTRAVENOUS

## 2018-07-05 NOTE — Progress Notes (Signed)
Cristina Gregory presented for Portacath access and flush. Portacath located left chest wall accessed with  H 20 needle.  Good blood return present. Portacath flushed with 44ml NS and 500U/63ml Heparin and needle removed intact.  Procedure tolerated well and without incident. Pt stable and discharged home ambulatory with family. Pt to return in 1 week for follow up visit and 12 weeks for port flush.

## 2018-07-06 DIAGNOSIS — E1129 Type 2 diabetes mellitus with other diabetic kidney complication: Secondary | ICD-10-CM | POA: Diagnosis not present

## 2018-07-06 DIAGNOSIS — E782 Mixed hyperlipidemia: Secondary | ICD-10-CM | POA: Diagnosis not present

## 2018-07-06 DIAGNOSIS — K5901 Slow transit constipation: Secondary | ICD-10-CM | POA: Diagnosis not present

## 2018-07-06 DIAGNOSIS — N183 Chronic kidney disease, stage 3 (moderate): Secondary | ICD-10-CM | POA: Diagnosis not present

## 2018-07-06 DIAGNOSIS — Z23 Encounter for immunization: Secondary | ICD-10-CM | POA: Diagnosis not present

## 2018-07-06 DIAGNOSIS — M109 Gout, unspecified: Secondary | ICD-10-CM | POA: Diagnosis not present

## 2018-07-06 DIAGNOSIS — E039 Hypothyroidism, unspecified: Secondary | ICD-10-CM | POA: Diagnosis not present

## 2018-07-06 DIAGNOSIS — I10 Essential (primary) hypertension: Secondary | ICD-10-CM | POA: Diagnosis not present

## 2018-07-12 ENCOUNTER — Other Ambulatory Visit (HOSPITAL_COMMUNITY): Payer: Self-pay

## 2018-07-12 ENCOUNTER — Encounter (HOSPITAL_COMMUNITY): Payer: Self-pay | Admitting: Internal Medicine

## 2018-07-12 ENCOUNTER — Inpatient Hospital Stay (HOSPITAL_COMMUNITY): Payer: Medicare HMO | Admitting: Internal Medicine

## 2018-07-12 ENCOUNTER — Other Ambulatory Visit: Payer: Self-pay

## 2018-07-12 VITALS — BP 167/59 | HR 62 | Temp 97.7°F | Resp 16 | Wt 139.1 lb

## 2018-07-12 DIAGNOSIS — I129 Hypertensive chronic kidney disease with stage 1 through stage 4 chronic kidney disease, or unspecified chronic kidney disease: Secondary | ICD-10-CM | POA: Diagnosis not present

## 2018-07-12 DIAGNOSIS — C8330 Diffuse large B-cell lymphoma, unspecified site: Secondary | ICD-10-CM

## 2018-07-12 DIAGNOSIS — E039 Hypothyroidism, unspecified: Secondary | ICD-10-CM | POA: Diagnosis not present

## 2018-07-12 DIAGNOSIS — N183 Chronic kidney disease, stage 3 (moderate): Secondary | ICD-10-CM | POA: Diagnosis not present

## 2018-07-12 DIAGNOSIS — Z79899 Other long term (current) drug therapy: Secondary | ICD-10-CM | POA: Diagnosis not present

## 2018-07-12 DIAGNOSIS — Z7982 Long term (current) use of aspirin: Secondary | ICD-10-CM | POA: Diagnosis not present

## 2018-07-12 DIAGNOSIS — N189 Chronic kidney disease, unspecified: Secondary | ICD-10-CM | POA: Diagnosis not present

## 2018-07-12 NOTE — Progress Notes (Signed)
Diagnosis Diffuse large B-cell lymphoma, unspecified body region ([place]) - Plan: CBC with Differential/Platelet, Comprehensive metabolic panel, Lactate dehydrogenase  Staging Cancer Staging DLBCL (diffuse large B cell lymphoma) ([place]) Staging form: Lymphoid Neoplasms, AJCC 6th Edition - Clinical stage from 05/26/2015: Stage II - Signed by Baird Cancer, PA-C on 07/13/2016   Assessment and Plan:   1.  DLBCL.  Pt was followed by Dr. Talbert Cage.  Last scans done 2017 showed no adenopathy.  She remains on observation.  Denies B symptoms.    Labs done 07/05/2018 reviewed and showed WBC 8 HB 12.9 plts 200,000.  Chemistries WNL with K+ 4.2, and normal LFTs.  Cr 1.48.  Pt will RTC in 12/2018 for follow-up and repeat labs.      2.  HTN.   BP 167/59.  Follow-up with PCP.    3.  Hypothyroidism.  Pt on synthroid.  Follow-up with PCP for monitoring.   4.  CKD.  Cr 1.48.  Will repeat labs on RTC.    Interval history:   From Kirby Crigler, PA-C's last note on 07/13/16)    Current Status:  Pt is seen today for follow-up.  She denies fevers, chills, night sweats and has noted no adenopathy.     DLBCL (diffuse large B cell lymphoma) ([place])   05/06/2015 Initial Diagnosis    DLBCL (diffuse large B cell lymphoma) ([place]).  Diagnosis via right supraclavicular/cervical lymph node biopsy.    05/26/2015 Cancer Staging    Stage IIB    05/26/2015 PET scan    Hypermetabolic right supraclavicular adenopathy. Mild hypermetabolic AP window lymph node. A focal hypermetabolic wall thickening in the mid sigmoid colon, correlate for acute diverticulitis versus mass. No hypermetabolic right lung base nodule, likely too small for evaluation by PET    05/28/2015 Echocardiogram    Left ventricle is normal in size. Mild concentric left ventricular hypertrophy. Left ventricle ejection fraction is normal (60-65 percent). Left ventricular diastolic function is abnormal. There is mild aortic regurgitation.    06/04/2015 -  Chemotherapy   RCHOP, administered by Dr. Tressie Stalker at [facility].  Dose reduction beginning on cycle two.  Due to complications, received 4 cycles of chemotherapy. Her last 2 cycles cancelled.    07/06/2015 Adverse Reaction    Hospitalized with neutropenic fever but negative blood cultures    08/03/2015 Remission    PET scan shows complete remission after 3 cycles of chemotherapy    08/03/2015 PET scan    A near complete resolution of right supraclavicular nodes. No hypermetabolic activity remaining. Stable activity within the left thyroid lobe. Ultrasound for further evaluation if clinically indicated. Stable 3 mm right basilar nodule. Nonspecific activity in the sigmoid colon is increased.    08/20/2015 Adverse Reaction    Hospitalized after cycle 4 with neutropenic fever, thrombocytopenia, and Staphylococcus epidermidis blood culture positive 1    09/11/2015 - 10/30/2015 Radiation Therapy    36 Gy delivered to areas of previous involvement due to inability to tolerate further chemotherapy.      Problem List Patient Active Problem List   Diagnosis Date Noted  . Abnormal PET scan of colon [R94.8] 02/07/2017  . DLBCL (diffuse large B cell lymphoma) ([place]) [C83.30] 07/13/2016    Past Medical History Past Medical History:  Diagnosis Date  . Chronic renal insufficiency   . DLBCL (diffuse large B cell lymphoma) ([place]) 07/13/2016  . Gout   . Hypertension   . Hypothyroidism     Past Surgical History Past Surgical History:  Procedure Laterality  Date  . COLONOSCOPY N/A 03/06/2017   Procedure: COLONOSCOPY;  Surgeon: Danie Binder, MD;  Location: [facility] ENDO SUITE;  Service: Endoscopy;  Laterality: N/A;  10:00am  . Port-A-Cath placement    . SLT LASER APPLICATION Right 9/83/3825   Procedure: SLT LASER APPLICATION;  Surgeon: Williams Che, MD;  Location: [facility] ORS;  Service: Ophthalmology;  Laterality: Right;    Family History Family History  Problem Relation Age of Onset  . Colon cancer Mother         Patient states her mother has been: Issues before it could be evaluated she died from heart disease.     Social History  reports that she has never smoked. She has never used smokeless tobacco. She reports that she does not drink alcohol or use drugs.  Medications  Current Outpatient Medications:  .  allopurinol (ZYLOPRIM) 300 MG tablet, Take 300 mg by mouth daily at 12 noon. , Disp: , Rfl:  .  amLODipine (NORVASC) 5 MG tablet, Take 5 mg by mouth daily at 12 noon. , Disp: , Rfl:  .  aspirin 81 MG EC tablet, Take 81 mg by mouth daily at 12 noon. , Disp: , Rfl:  .  clopidogrel (PLAVIX) 75 MG tablet, Take 75 mg by mouth daily at 12 noon. , Disp: , Rfl:  .  hydrochlorothiazide (HYDRODIURIL) 12.5 MG tablet, Take 12.5 mg by mouth daily at 12 noon., Disp: , Rfl:  .  levothyroxine (SYNTHROID, LEVOTHROID) 88 MCG tablet, Take 88 mcg by mouth daily before breakfast., Disp: , Rfl:  .  lidocaine-prilocaine (EMLA) cream, Apply 1 application topically as needed (port access)., Disp: , Rfl:  .  metoprolol tartrate (LOPRESSOR) 25 MG tablet, Take 25 mg by mouth 2 (two) times daily., Disp: , Rfl:  .  polyethylene glycol (MIRALAX / GLYCOLAX) packet, Take 17 g by mouth daily., Disp: , Rfl:  .  simvastatin (ZOCOR) 20 MG tablet, Take 20 mg by mouth at bedtime. , Disp: , Rfl:   Allergies Patient has no known allergies.  Review of Systems Review of Systems - Oncology ROS negative.     Physical Exam  Vitals Wt Readings from Last 3 Encounters:  07/12/18 139 lb 1.6 oz (63.1 kg)  01/08/18 137 lb (62.1 kg)  07/10/17 135 lb 3.2 oz (61.3 kg)   Temp Readings from Last 3 Encounters:  07/12/18 97.7 F (36.5 C) (Oral)  07/05/18 98.4 F (36.9 C) (Oral)  04/02/18 98 F (36.7 C) (Oral)   BP Readings from Last 3 Encounters:  07/12/18 (!) 167/59  07/05/18 (!) 172/63  04/02/18 (!) 183/74   Pulse Readings from Last 3 Encounters:  07/12/18 62  07/05/18 68  04/02/18 73   Constitutional: Well-developed,  well-nourished, and in no distress.   HENT: Head: Normocephalic and atraumatic.  Mouth/Throat: No oropharyngeal exudate. Mucosa moist. Eyes: Pupils are equal, round, and reactive to light. Conjunctivae are normal. No scleral icterus.  Neck: Normal range of motion. Neck supple. No JVD present.  Cardiovascular: Normal rate, regular rhythm and normal heart sounds.  Exam reveals no gallop and no friction rub.   No murmur heard. Pulmonary/Chest: Effort normal and breath sounds normal. No respiratory distress. No wheezes.No rales.  Abdominal: Soft. Bowel sounds are normal. No distension. There is no tenderness. There is no guarding.  Musculoskeletal: No edema or tenderness.  Lymphadenopathy: No cervical, axillary or supraclavicular adenopathy.  Neurological: Alert and oriented to person, place, and time. No cranial nerve deficit.  Skin: Skin is warm  and dry. No rash noted. No erythema. No pallor.  Psychiatric: Affect and judgment normal.   Labs No visits with results within 3 Day(s) from this visit.  Latest known visit with results is:  Infusion on 07/05/2018  Component Date Value Ref Range Status  . LDH 07/05/2018 140  98 - 192 U/L Final   Performed at [facility], [address] [place], [place] [address]  . Sodium 07/05/2018 139  135 - 145 mmol/L Final  . Potassium 07/05/2018 4.2  3.5 - 5.1 mmol/L Final  . Chloride 07/05/2018 103  98 - 111 mmol/L Final  . CO2 07/05/2018 30  22 - 32 mmol/L Final  . Glucose, Bld 07/05/2018 114* 70 - 99 mg/dL Final  . BUN 07/05/2018 31* 8 - 23 mg/dL Final  . Creatinine, Ser 07/05/2018 1.48* 0.44 - 1.00 mg/dL Final  . Calcium 07/05/2018 9.4  8.9 - 10.3 mg/dL Final  . Total Protein 07/05/2018 7.9  6.5 - 8.1 g/dL Final  . Albumin 07/05/2018 4.3  3.5 - 5.0 g/dL Final  . AST 07/05/2018 31  15 - 41 U/L Final  . ALT 07/05/2018 34  0 - 44 U/L Final  . Alkaline Phosphatase 07/05/2018 96  38 - 126 U/L Final  . Total Bilirubin 07/05/2018 0.6  0.3 - 1.2 mg/dL Final   . GFR calc non Af Amer 07/05/2018 32* >60 mL/min Final  . GFR calc Af Amer 07/05/2018 37* >60 mL/min Final   Comment: (NOTE) The eGFR has been calculated using the CKD EPI equation. This calculation has not been validated in all clinical situations. eGFR's persistently <60 mL/min signify possible Chronic Kidney Disease.   Georgiann Hahn gap 07/05/2018 6  5 - 15 Final   Performed at [facility], [address], [place], [place] [address]  . WBC 07/05/2018 8.0  4.0 - 10.5 K/uL Final  . RBC 07/05/2018 4.25  3.87 - 5.11 MIL/uL Final  . Hemoglobin 07/05/2018 12.9  12.0 - 15.0 g/dL Final  . HCT 07/05/2018 39.2  36.0 - 46.0 % Final  . MCV 07/05/2018 92.2  78.0 - 100.0 fL Final  . MCH 07/05/2018 30.4  26.0 - 34.0 pg Final  . MCHC 07/05/2018 32.9  30.0 - 36.0 g/dL Final  . RDW 07/05/2018 15.4  11.5 - 15.5 % Final  . Platelets 07/05/2018 200  150 - 400 K/uL Final  . Neutrophils Relative % 07/05/2018 58  % Final  . Neutro Abs 07/05/2018 4.6  1.7 - 7.7 K/uL Final  . Lymphocytes Relative 07/05/2018 29  % Final  . Lymphs Abs 07/05/2018 2.3  0.7 - 4.0 K/uL Final  . Monocytes Relative 07/05/2018 10  % Final  . Monocytes Absolute 07/05/2018 0.8  0.1 - 1.0 K/uL Final  . Eosinophils Relative 07/05/2018 2  % Final  . Eosinophils Absolute 07/05/2018 0.2  0.0 - 0.7 K/uL Final  . Basophils Relative 07/05/2018 1  % Final  . Basophils Absolute 07/05/2018 0.0  0.0 - 0.1 K/uL Final   Performed at [facility], [address], [place], [place] [address]     Pathology Orders Placed This Encounter  Procedures  . CBC with Differential/Platelet    Standing Status:   Future    Standing Expiration Date:   07/12/2020  . Comprehensive metabolic panel    Standing Status:   Future    Standing Expiration Date:   07/12/2020  . Lactate dehydrogenase    Standing Status:   Future    Standing Expiration Date:  07/12/2020       Zoila Shutter MD

## 2018-07-12 NOTE — Patient Instructions (Signed)
[place] at [facility] Discharge Instructions  You saw Dr. Walden Field today. Follow up with [place] in March with labs.   Thank you for choosing [place] at [facility] to provide your oncology and hematology care.  To afford each patient quality time with our provider, please arrive at least 15 minutes before your scheduled appointment time.   If you have a lab appointment with the [facility] please come in thru the  Main Entrance and check in at the main information desk  You need to re-schedule your appointment should you arrive 10 or more minutes late.  We strive to give you quality time with our providers, and arriving late affects you and other patients whose appointments are after yours.  Also, if you no show three or more times for appointments you may be dismissed from the clinic at the providers discretion.     Again, thank you for choosing [facility].  Our hope is that these requests will decrease the amount of time that you wait before being seen by our physicians.       _____________________________________________________________  Should you have questions after your visit to [facility], please contact our office at [phone] between the hours of 8:00 a.m. and 4:30 p.m.  Voicemails left after 4:00 p.m. will not be returned until the following business day.  For prescription refill requests, have your pharmacy contact our office and allow 72 hours.    Cancer Center Support Programs:   > Cancer Support Group  2nd Tuesday of the month 1pm-2pm, Journey Room

## 2018-09-27 ENCOUNTER — Inpatient Hospital Stay (HOSPITAL_COMMUNITY): Payer: Medicare HMO | Attending: Hematology

## 2018-09-27 ENCOUNTER — Encounter (HOSPITAL_COMMUNITY): Payer: Self-pay

## 2018-09-27 DIAGNOSIS — C8332 Diffuse large B-cell lymphoma, intrathoracic lymph nodes: Secondary | ICD-10-CM | POA: Insufficient documentation

## 2018-09-27 DIAGNOSIS — C8330 Diffuse large B-cell lymphoma, unspecified site: Secondary | ICD-10-CM

## 2018-09-27 LAB — COMPREHENSIVE METABOLIC PANEL
ALT: 30 U/L (ref 0–44)
AST: 28 U/L (ref 15–41)
Albumin: 4.2 g/dL (ref 3.5–5.0)
Alkaline Phosphatase: 86 U/L (ref 38–126)
Anion gap: 9 (ref 5–15)
BILIRUBIN TOTAL: 0.4 mg/dL (ref 0.3–1.2)
BUN: 30 mg/dL — ABNORMAL HIGH (ref 8–23)
CALCIUM: 9.4 mg/dL (ref 8.9–10.3)
CO2: 26 mmol/L (ref 22–32)
Chloride: 106 mmol/L (ref 98–111)
Creatinine, Ser: 1.51 mg/dL — ABNORMAL HIGH (ref 0.44–1.00)
GFR calc non Af Amer: 32 mL/min — ABNORMAL LOW (ref >60)
GFR, EST AFRICAN AMERICAN: 37 mL/min — AB (ref >60)
Glucose, Bld: 162 mg/dL — ABNORMAL HIGH (ref 70–99)
POTASSIUM: 3.9 mmol/L (ref 3.5–5.1)
SODIUM: 141 mmol/L (ref 135–145)
TOTAL PROTEIN: 7.6 g/dL (ref 6.5–8.1)

## 2018-09-27 LAB — CBC WITH DIFFERENTIAL/PLATELET
Abs Immature Granulocytes: 0.02 10*3/uL (ref 0.00–0.07)
BASOS ABS: 0.1 10*3/uL (ref 0.0–0.1)
BASOS PCT: 1 %
EOS ABS: 0.2 10*3/uL (ref 0.0–0.5)
Eosinophils Relative: 2 %
HCT: 40.9 % (ref 36.0–46.0)
Hemoglobin: 12.7 g/dL (ref 12.0–15.0)
Immature Granulocytes: 0 %
Lymphocytes Relative: 28 %
Lymphs Abs: 2.2 10*3/uL (ref 0.7–4.0)
MCH: 29.3 pg (ref 26.0–34.0)
MCHC: 31.1 g/dL (ref 30.0–36.0)
MCV: 94.2 fL (ref 80.0–100.0)
Monocytes Absolute: 0.8 10*3/uL (ref 0.1–1.0)
Monocytes Relative: 10 %
NEUTROS ABS: 4.5 10*3/uL (ref 1.7–7.7)
NEUTROS PCT: 59 %
NRBC: 0 % (ref 0.0–0.2)
PLATELETS: 204 10*3/uL (ref 150–400)
RBC: 4.34 MIL/uL (ref 3.87–5.11)
RDW: 15.1 % (ref 11.5–15.5)
WBC: 7.7 10*3/uL (ref 4.0–10.5)

## 2018-09-27 LAB — LACTATE DEHYDROGENASE: LDH: 130 U/L (ref 98–192)

## 2018-09-27 MED ORDER — HEPARIN SOD (PORK) LOCK FLUSH 100 UNIT/ML IV SOLN
500.0000 [IU] | Freq: Once | INTRAVENOUS | Status: AC
  Administered 2018-09-27: 500 [IU] via INTRAVENOUS

## 2018-09-27 MED ORDER — SODIUM CHLORIDE 0.9% FLUSH
10.0000 mL | Freq: Once | INTRAVENOUS | Status: AC
  Administered 2018-09-27: 10 mL via INTRAVENOUS

## 2018-09-27 NOTE — Progress Notes (Signed)
Patients port flushed with lab draw.  Good blood return noted.  Site clean and dry with no bruising or swelling noted at site.  Band aid applied.  VSs with discharge and left ambulatory with no s/s of distress noted.

## 2018-09-27 NOTE — Patient Instructions (Signed)
[facility]  Discharge Instructions:   _______________________________________________________________  Thank you for choosing [facility] at [facility] to provide your oncology and hematology care.  To afford each patient quality time with our providers, please arrive at least 15 minutes before your scheduled appointment.  You need to re-schedule your appointment if you arrive 10 or more minutes late.  We strive to give you quality time with our providers, and arriving late affects you and other patients whose appointments are after yours.  Also, if you no show three or more times for appointments you may be dismissed from the clinic.  Again, thank you for choosing [facility] at [facility]. Our hope is that these requests will allow you access to exceptional care and in a timely manner. _______________________________________________________________  If you have questions after your visit, please contact our office at [phone] between the hours of 8:30 a.m. and 5:00 p.m. Voicemails left after 4:30 p.m. will not be returned until the following business day. _______________________________________________________________  For prescription refill requests, have your pharmacy contact our office. _______________________________________________________________  Recommendations made by the consultant and any test results will be sent to your referring physician. _______________________________________________________________ 

## 2018-11-05 DIAGNOSIS — E875 Hyperkalemia: Secondary | ICD-10-CM | POA: Diagnosis not present

## 2018-11-05 DIAGNOSIS — I10 Essential (primary) hypertension: Secondary | ICD-10-CM | POA: Diagnosis not present

## 2018-11-05 DIAGNOSIS — E1121 Type 2 diabetes mellitus with diabetic nephropathy: Secondary | ICD-10-CM | POA: Diagnosis not present

## 2018-11-05 DIAGNOSIS — E782 Mixed hyperlipidemia: Secondary | ICD-10-CM | POA: Diagnosis not present

## 2018-11-05 DIAGNOSIS — E7800 Pure hypercholesterolemia, unspecified: Secondary | ICD-10-CM | POA: Diagnosis not present

## 2018-11-05 DIAGNOSIS — N183 Chronic kidney disease, stage 3 (moderate): Secondary | ICD-10-CM | POA: Diagnosis not present

## 2018-11-05 DIAGNOSIS — E039 Hypothyroidism, unspecified: Secondary | ICD-10-CM | POA: Diagnosis not present

## 2018-11-08 DIAGNOSIS — E039 Hypothyroidism, unspecified: Secondary | ICD-10-CM | POA: Diagnosis not present

## 2018-11-08 DIAGNOSIS — I69319 Unspecified symptoms and signs involving cognitive functions following cerebral infarction: Secondary | ICD-10-CM | POA: Diagnosis not present

## 2018-11-08 DIAGNOSIS — E782 Mixed hyperlipidemia: Secondary | ICD-10-CM | POA: Diagnosis not present

## 2018-11-08 DIAGNOSIS — N183 Chronic kidney disease, stage 3 (moderate): Secondary | ICD-10-CM | POA: Diagnosis not present

## 2018-11-08 DIAGNOSIS — E1129 Type 2 diabetes mellitus with other diabetic kidney complication: Secondary | ICD-10-CM | POA: Diagnosis not present

## 2018-11-08 DIAGNOSIS — I10 Essential (primary) hypertension: Secondary | ICD-10-CM | POA: Diagnosis not present

## 2018-11-08 DIAGNOSIS — K5901 Slow transit constipation: Secondary | ICD-10-CM | POA: Diagnosis not present

## 2018-11-08 DIAGNOSIS — Z6823 Body mass index (BMI) 23.0-23.9, adult: Secondary | ICD-10-CM | POA: Diagnosis not present

## 2018-11-09 DIAGNOSIS — E1129 Type 2 diabetes mellitus with other diabetic kidney complication: Secondary | ICD-10-CM | POA: Diagnosis not present

## 2018-11-09 DIAGNOSIS — K5901 Slow transit constipation: Secondary | ICD-10-CM | POA: Diagnosis not present

## 2018-11-09 DIAGNOSIS — E039 Hypothyroidism, unspecified: Secondary | ICD-10-CM | POA: Diagnosis not present

## 2018-11-09 DIAGNOSIS — M109 Gout, unspecified: Secondary | ICD-10-CM | POA: Diagnosis not present

## 2018-11-09 DIAGNOSIS — C8589 Other specified types of non-Hodgkin lymphoma, extranodal and solid organ sites: Secondary | ICD-10-CM | POA: Diagnosis not present

## 2018-11-09 DIAGNOSIS — I10 Essential (primary) hypertension: Secondary | ICD-10-CM | POA: Diagnosis not present

## 2018-11-09 DIAGNOSIS — E782 Mixed hyperlipidemia: Secondary | ICD-10-CM | POA: Diagnosis not present

## 2018-11-09 DIAGNOSIS — N183 Chronic kidney disease, stage 3 (moderate): Secondary | ICD-10-CM | POA: Diagnosis not present

## 2018-11-09 DIAGNOSIS — M818 Other osteoporosis without current pathological fracture: Secondary | ICD-10-CM | POA: Diagnosis not present

## 2018-11-09 DIAGNOSIS — I69319 Unspecified symptoms and signs involving cognitive functions following cerebral infarction: Secondary | ICD-10-CM | POA: Diagnosis not present

## 2018-12-10 DIAGNOSIS — I129 Hypertensive chronic kidney disease with stage 1 through stage 4 chronic kidney disease, or unspecified chronic kidney disease: Secondary | ICD-10-CM | POA: Diagnosis not present

## 2018-12-10 DIAGNOSIS — E039 Hypothyroidism, unspecified: Secondary | ICD-10-CM | POA: Diagnosis not present

## 2018-12-10 DIAGNOSIS — E785 Hyperlipidemia, unspecified: Secondary | ICD-10-CM | POA: Diagnosis not present

## 2018-12-10 DIAGNOSIS — K5900 Constipation, unspecified: Secondary | ICD-10-CM | POA: Diagnosis not present

## 2018-12-10 DIAGNOSIS — E1122 Type 2 diabetes mellitus with diabetic chronic kidney disease: Secondary | ICD-10-CM | POA: Diagnosis not present

## 2018-12-10 DIAGNOSIS — M109 Gout, unspecified: Secondary | ICD-10-CM | POA: Diagnosis not present

## 2018-12-10 DIAGNOSIS — I69398 Other sequelae of cerebral infarction: Secondary | ICD-10-CM | POA: Diagnosis not present

## 2018-12-10 DIAGNOSIS — H5461 Unqualified visual loss, right eye, normal vision left eye: Secondary | ICD-10-CM | POA: Diagnosis not present

## 2018-12-10 DIAGNOSIS — C8590 Non-Hodgkin lymphoma, unspecified, unspecified site: Secondary | ICD-10-CM | POA: Diagnosis not present

## 2018-12-10 DIAGNOSIS — N183 Chronic kidney disease, stage 3 (moderate): Secondary | ICD-10-CM | POA: Diagnosis not present

## 2019-01-04 ENCOUNTER — Inpatient Hospital Stay (HOSPITAL_COMMUNITY): Payer: Medicare HMO | Attending: Hematology

## 2019-01-04 ENCOUNTER — Encounter (HOSPITAL_COMMUNITY): Payer: Self-pay

## 2019-01-04 ENCOUNTER — Other Ambulatory Visit: Payer: Self-pay

## 2019-01-04 DIAGNOSIS — C8330 Diffuse large B-cell lymphoma, unspecified site: Secondary | ICD-10-CM | POA: Insufficient documentation

## 2019-01-04 DIAGNOSIS — Z923 Personal history of irradiation: Secondary | ICD-10-CM | POA: Insufficient documentation

## 2019-01-04 DIAGNOSIS — N183 Chronic kidney disease, stage 3 (moderate): Secondary | ICD-10-CM | POA: Diagnosis not present

## 2019-01-04 DIAGNOSIS — I10 Essential (primary) hypertension: Secondary | ICD-10-CM | POA: Insufficient documentation

## 2019-01-04 DIAGNOSIS — E039 Hypothyroidism, unspecified: Secondary | ICD-10-CM | POA: Insufficient documentation

## 2019-01-04 LAB — CBC WITH DIFFERENTIAL/PLATELET
Abs Immature Granulocytes: 0.02 10*3/uL (ref 0.00–0.07)
BASOS PCT: 1 %
Basophils Absolute: 0 10*3/uL (ref 0.0–0.1)
EOS ABS: 0.2 10*3/uL (ref 0.0–0.5)
EOS PCT: 2 %
HCT: 42 % (ref 36.0–46.0)
HEMOGLOBIN: 13.4 g/dL (ref 12.0–15.0)
Immature Granulocytes: 0 %
LYMPHS PCT: 25 %
Lymphs Abs: 2 10*3/uL (ref 0.7–4.0)
MCH: 30 pg (ref 26.0–34.0)
MCHC: 31.9 g/dL (ref 30.0–36.0)
MCV: 94.2 fL (ref 80.0–100.0)
MONO ABS: 0.8 10*3/uL (ref 0.1–1.0)
Monocytes Relative: 10 %
Neutro Abs: 4.8 10*3/uL (ref 1.7–7.7)
Neutrophils Relative %: 62 %
Platelets: 200 10*3/uL (ref 150–400)
RBC: 4.46 MIL/uL (ref 3.87–5.11)
RDW: 14.8 % (ref 11.5–15.5)
WBC: 7.7 10*3/uL (ref 4.0–10.5)
nRBC: 0 % (ref 0.0–0.2)

## 2019-01-04 LAB — COMPREHENSIVE METABOLIC PANEL
ALT: 21 U/L (ref 0–44)
AST: 20 U/L (ref 15–41)
Albumin: 4.4 g/dL (ref 3.5–5.0)
Alkaline Phosphatase: 100 U/L (ref 38–126)
Anion gap: 11 (ref 5–15)
BILIRUBIN TOTAL: 0.5 mg/dL (ref 0.3–1.2)
BUN: 28 mg/dL — AB (ref 8–23)
CALCIUM: 9.5 mg/dL (ref 8.9–10.3)
CO2: 24 mmol/L (ref 22–32)
CREATININE: 1.5 mg/dL — AB (ref 0.44–1.00)
Chloride: 103 mmol/L (ref 98–111)
GFR calc Af Amer: 37 mL/min — ABNORMAL LOW (ref >60)
GFR, EST NON AFRICAN AMERICAN: 32 mL/min — AB (ref >60)
Glucose, Bld: 156 mg/dL — ABNORMAL HIGH (ref 70–99)
Potassium: 4.2 mmol/L (ref 3.5–5.1)
Sodium: 138 mmol/L (ref 135–145)
TOTAL PROTEIN: 7.7 g/dL (ref 6.5–8.1)

## 2019-01-04 LAB — LACTATE DEHYDROGENASE: LDH: 123 U/L (ref 98–192)

## 2019-01-04 MED ORDER — HEPARIN SOD (PORK) LOCK FLUSH 100 UNIT/ML IV SOLN
500.0000 [IU] | Freq: Once | INTRAVENOUS | Status: AC
  Administered 2019-01-04: 500 [IU] via INTRAVENOUS

## 2019-01-04 MED ORDER — SODIUM CHLORIDE 0.9% FLUSH
10.0000 mL | Freq: Once | INTRAVENOUS | Status: AC
  Administered 2019-01-04: 10 mL

## 2019-01-04 NOTE — Progress Notes (Signed)
Cristina Gregory presented for Portacath flush with lab draw.  Portacath located in the left chest wall accessed with  H 20 needle. Clean, Dry and Intact Good blood return present. Portacath flushed with 89ml NS and 500U/79ml Heparin per protocol and needle removed intact. Procedure without incident. Patient tolerated procedure well.   Vital signs stable. No complaints at this time. Discharged from clinic ambulatory. F/U with [facility] as scheduled.

## 2019-01-04 NOTE — Patient Instructions (Signed)
[facility]  Discharge Instructions:   _______________________________________________________________  Thank you for choosing [facility] at [facility] to provide your oncology and hematology care.  To afford each patient quality time with our providers, please arrive at least 15 minutes before your scheduled appointment.  You need to re-schedule your appointment if you arrive 10 or more minutes late.  We strive to give you quality time with our providers, and arriving late affects you and other patients whose appointments are after yours.  Also, if you no show three or more times for appointments you may be dismissed from the clinic.  Again, thank you for choosing [facility] at [facility]. Our hope is that these requests will allow you access to exceptional care and in a timely manner. _______________________________________________________________  If you have questions after your visit, please contact our office at [phone] between the hours of 8:30 a.m. and 5:00 p.m. Voicemails left after 4:30 p.m. will not be returned until the following business day. _______________________________________________________________  For prescription refill requests, have your pharmacy contact our office. _______________________________________________________________  Recommendations made by the consultant and any test results will be sent to your referring physician. _______________________________________________________________ 

## 2019-01-11 ENCOUNTER — Inpatient Hospital Stay (HOSPITAL_BASED_OUTPATIENT_CLINIC_OR_DEPARTMENT_OTHER): Payer: Medicare HMO | Admitting: Hematology

## 2019-01-11 ENCOUNTER — Other Ambulatory Visit: Payer: Self-pay

## 2019-01-11 ENCOUNTER — Other Ambulatory Visit (HOSPITAL_COMMUNITY): Payer: Self-pay

## 2019-01-11 ENCOUNTER — Encounter (HOSPITAL_COMMUNITY): Payer: Self-pay | Admitting: Hematology

## 2019-01-11 DIAGNOSIS — N183 Chronic kidney disease, stage 3 (moderate): Secondary | ICD-10-CM | POA: Diagnosis not present

## 2019-01-11 DIAGNOSIS — Z923 Personal history of irradiation: Secondary | ICD-10-CM

## 2019-01-11 DIAGNOSIS — C8330 Diffuse large B-cell lymphoma, unspecified site: Secondary | ICD-10-CM

## 2019-01-11 DIAGNOSIS — I10 Essential (primary) hypertension: Secondary | ICD-10-CM | POA: Diagnosis not present

## 2019-01-11 DIAGNOSIS — E039 Hypothyroidism, unspecified: Secondary | ICD-10-CM | POA: Diagnosis not present

## 2019-01-11 NOTE — Progress Notes (Signed)
[place], [place] [address]   CLINIC:  Medical Oncology/Hematology  PCP:  Manon Hilding, MD [place] [id number] [phone]   REASON FOR VISIT:  Follow-up for large B-cell lymphoma.   BRIEF ONCOLOGIC HISTORY:    DLBCL (diffuse large B cell lymphoma) ([place])   05/06/2015 Initial Diagnosis    DLBCL (diffuse large B cell lymphoma) ([place]).  Diagnosis via right supraclavicular/cervical lymph node biopsy.    05/26/2015 Cancer Staging    Stage IIB    05/26/2015 PET scan    Hypermetabolic right supraclavicular adenopathy. Mild hypermetabolic AP window lymph node. A focal hypermetabolic wall thickening in the mid sigmoid colon, correlate for acute diverticulitis versus mass. No hypermetabolic right lung base nodule, likely too small for evaluation by PET    05/28/2015 Echocardiogram    Left ventricle is normal in size. Mild concentric left ventricular hypertrophy. Left ventricle ejection fraction is normal (60-65 percent). Left ventricular diastolic function is abnormal. There is mild aortic regurgitation.    06/04/2015 -  Chemotherapy    RCHOP, administered by Dr. Tressie Stalker at [facility].  Dose reduction beginning on cycle two.  Due to complications, received 4 cycles of chemotherapy. Her last 2 cycles cancelled.    07/06/2015 Adverse Reaction    Hospitalized with neutropenic fever but negative blood cultures    08/03/2015 Remission    PET scan shows complete remission after 3 cycles of chemotherapy    08/03/2015 PET scan    A near complete resolution of right supraclavicular nodes. No hypermetabolic activity remaining. Stable activity within the left thyroid lobe. Ultrasound for further evaluation if clinically indicated. Stable 3 mm right basilar nodule. Nonspecific activity in the sigmoid colon is increased.    08/20/2015 Adverse Reaction    Hospitalized after cycle 4 with neutropenic fever, thrombocytopenia, and Staphylococcus epidermidis  blood culture positive 1    09/11/2015 - 10/30/2015 Radiation Therapy    36 Gy delivered to areas of previous involvement due to inability to tolerate further chemotherapy.      CANCER STAGING: Cancer Staging DLBCL (diffuse large B cell lymphoma) (HCC) Staging form: Lymphoid Neoplasms, AJCC 6th Edition - Clinical stage from 05/26/2015: Stage II - Signed by Baird Cancer, PA-C on 07/13/2016    INTERVAL HISTORY:  Cristina Gregory 84 y.o. female returns for routine follow-up. She is here today with a friend. She states that she has not had trouble since her last visit. She denies any new lumps or bumps. She denies any recent infections. Denies any night sweats or weight loss.  Denies any nausea, vomiting, or diarrhea. Denies any new pains. Had not noticed any recent bleeding such as epistaxis, hematuria or hematochezia. Denies recent chest pain on exertion, shortness of breath on minimal exertion, pre-syncopal episodes, or palpitations. Denies any numbness or tingling in hands or feet. Denies any recent fevers, infections, or recent hospitalizations. Patient reports appetite at 100% and energy level at 25%.    REVIEW OF SYSTEMS:  Review of Systems  All other systems reviewed and are negative.    PAST MEDICAL/SURGICAL HISTORY:  Past Medical History:  Diagnosis Date   Chronic renal insufficiency    DLBCL (diffuse large B cell lymphoma) ([place]) 07/13/2016   Gout    Hypertension    Hypothyroidism    Past Surgical History:  Procedure Laterality Date   COLONOSCOPY N/A 03/06/2017   Procedure: COLONOSCOPY;  Surgeon: Danie Binder, MD;  Location: [facility];  Service:  Endoscopy;  Laterality: N/A;  10:00am   Port-A-Cath placement     SLT LASER APPLICATION Right 03/30/3709   Procedure: SLT LASER APPLICATION;  Surgeon: Williams Che, MD;  Location: [facility] ORS;  Service: Ophthalmology;  Laterality: Right;     SOCIAL HISTORY:  Social History   Socioeconomic History   Marital  status: Single    Spouse name: Not on file   Number of children: 4   Years of education: Not on file   Highest education level: Not on file  Occupational History   Not on file  Social Needs   Financial resource strain: Not on file   Food insecurity:    Worry: Not on file    Inability: Not on file   Transportation needs:    Medical: Not on file    Non-medical: Not on file  Tobacco Use   Smoking status: Never Smoker   Smokeless tobacco: Never Used  Substance and Sexual Activity   Alcohol use: No   Drug use: No   Sexual activity: Not on file  Lifestyle   Physical activity:    Days per week: Not on file    Minutes per session: Not on file   Stress: Not on file  Relationships   Social connections:    Talks on phone: Not on file    Gets together: Not on file    Attends religious service: Not on file    Active member of club or organization: Not on file    Attends meetings of clubs or organizations: Not on file    Relationship status: Not on file   Intimate partner violence:    Fear of current or ex partner: Not on file    Emotionally abused: Not on file    Physically abused: Not on file    Forced sexual activity: Not on file  Other Topics Concern   Not on file  Social History Narrative   Not on file    FAMILY HISTORY:  Family History  Problem Relation Age of Onset   Colon cancer Mother        Patient states her mother has been: Issues before it could be evaluated she died from heart disease.    CURRENT MEDICATIONS:  Outpatient Encounter Medications as of 01/11/2019  Medication Sig   allopurinol (ZYLOPRIM) 300 MG tablet Take 300 mg by mouth daily at 12 noon.    amLODipine (NORVASC) 5 MG tablet Take 5 mg by mouth daily at 12 noon.    aspirin 81 MG EC tablet Take 81 mg by mouth daily at 12 noon.    clopidogrel (PLAVIX) 75 MG tablet Take 75 mg by mouth daily at 12 noon.    hydrochlorothiazide (HYDRODIURIL) 12.5 MG tablet Take 12.5 mg by mouth  daily at 12 noon.   levothyroxine (SYNTHROID, LEVOTHROID) 88 MCG tablet Take 88 mcg by mouth daily before breakfast.   lidocaine-prilocaine (EMLA) cream Apply 1 application topically as needed (port access).   metoprolol tartrate (LOPRESSOR) 25 MG tablet Take 25 mg by mouth 2 (two) times daily.   polyethylene glycol (MIRALAX / GLYCOLAX) packet Take 17 g by mouth daily.   simvastatin (ZOCOR) 20 MG tablet Take 20 mg by mouth at bedtime.    No facility-administered encounter medications on file as of 01/11/2019.     ALLERGIES:  No Known Allergies   PHYSICAL EXAM:  ECOG Performance status: 1  We reviewed vitals.  Blood pressure is 163/54.  Pulse rate is 75.  Respiratory rate  is 18.  Temperature 98.  Saturations are 98%.  Physical Exam Constitutional:      Appearance: Normal appearance.  Cardiovascular:     Rate and Rhythm: Normal rate and regular rhythm.     Heart sounds: Normal heart sounds.  Pulmonary:     Effort: Pulmonary effort is normal.     Breath sounds: Normal breath sounds.  Abdominal:     Palpations: Abdomen is soft. There is no mass.  Musculoskeletal:     Left lower leg: No edema.  Lymphadenopathy:     Cervical: No cervical adenopathy.  Skin:    General: Skin is warm.  Neurological:     General: No focal deficit present.     Mental Status: She is alert and oriented to person, place, and time.  Psychiatric:        Mood and Affect: Mood normal.        Behavior: Behavior normal.    No axillary or inguinal adenopathy.  No splenomegaly.  LABORATORY DATA:  I have reviewed the labs as listed.  CBC    Component Value Date/Time   WBC 7.7 01/04/2019 1136   RBC 4.46 01/04/2019 1136   HGB 13.4 01/04/2019 1136   HCT 42.0 01/04/2019 1136   PLT 200 01/04/2019 1136   MCV 94.2 01/04/2019 1136   MCH 30.0 01/04/2019 1136   MCHC 31.9 01/04/2019 1136   RDW 14.8 01/04/2019 1136   LYMPHSABS 2.0 01/04/2019 1136   MONOABS 0.8 01/04/2019 1136   EOSABS 0.2 01/04/2019  1136   BASOSABS 0.0 01/04/2019 1136   CMP Latest Ref Rng & Units 01/04/2019 09/27/2018 07/05/2018  Glucose 70 - 99 mg/dL 156(H) 162(H) 114(H)  BUN 8 - 23 mg/dL 28(H) 30(H) 31(H)  Creatinine 0.44 - 1.00 mg/dL 1.50(H) 1.51(H) 1.48(H)  Sodium 135 - 145 mmol/L 138 141 139  Potassium 3.5 - 5.1 mmol/L 4.2 3.9 4.2  Chloride 98 - 111 mmol/L 103 106 103  CO2 22 - 32 mmol/L 24 26 30   Calcium 8.9 - 10.3 mg/dL 9.5 9.4 9.4  Total Protein 6.5 - 8.1 g/dL 7.7 7.6 7.9  Total Bilirubin 0.3 - 1.2 mg/dL 0.5 0.4 0.6  Alkaline Phos 38 - 126 U/L 100 86 96  AST 15 - 41 U/L 20 28 31   ALT 0 - 44 U/L 21 30 34       DIAGNOSTIC IMAGING:  I have independently reviewed the scans and discussed with the patient.   I have reviewed Venita Lick LPN's note and agree with the documentation.  I personally performed a face-to-face visit, made revisions and my assessment and plan is as follows.    ASSESSMENT & PLAN:   DLBCL (diffuse large B cell lymphoma) (HCC) 1.  Stage IIb diffuse large B-cell lymphoma: - 4 cycles of R-CHOP completed in October 2016, truncated secondary to recurrent infections. - XRT by Dr. Isidore Moos from 09/11/2015 through 10/30/2015. - Denies any fevers, night sweats or weight loss. -We reviewed her labs.  LDH was within normal limits.  Physical exam did not show any palpable adenopathy. -She will come back in 6 months for follow-up with repeat labs and physical exam.  We will do scans if clinical condition dictates.  2.  CKD: -This has been stable with creatinine around 1.5.      Orders placed this encounter:  Orders Placed This Encounter  Procedures   CBC with Differential/Platelet   Comprehensive metabolic panel   Lactate dehydrogenase      Derek Jack, MD [place]  [place] [phone]

## 2019-01-11 NOTE — Assessment & Plan Note (Signed)
1.  Stage IIb diffuse large B-cell lymphoma: - 4 cycles of R-CHOP completed in October 2016, truncated secondary to recurrent infections. - XRT by Dr. Isidore Moos from 09/11/2015 through 10/30/2015. - Denies any fevers, night sweats or weight loss. -We reviewed her labs.  LDH was within normal limits.  Physical exam did not show any palpable adenopathy. -She will come back in 6 months for follow-up with repeat labs and physical exam.  We will do scans if clinical condition dictates.  2.  CKD: -This has been stable with creatinine around 1.5.

## 2019-01-11 NOTE — Patient Instructions (Addendum)
[place] at [facility] Discharge Instructions  You were seen today by Dr. Delton Coombes. He went over your recent lab results and everything looked good. He will see you back in 6 months for labs and follow up.   Thank you for choosing [place] at [facility] to provide your oncology and hematology care.  To afford each patient quality time with our provider, please arrive at least 15 minutes before your scheduled appointment time.   If you have a lab appointment with the [place] please come in thru the  Main Entrance and check in at the main information desk  You need to re-schedule your appointment should you arrive 10 or more minutes late.  We strive to give you quality time with our providers, and arriving late affects you and other patients whose appointments are after yours.  Also, if you no show three or more times for appointments you may be dismissed from the clinic at the providers discretion.     Again, thank you for choosing [facility].  Our hope is that these requests will decrease the amount of time that you wait before being seen by our physicians.       _____________________________________________________________  Should you have questions after your visit to [facility], please contact our office at [phone] between the hours of 8:00 a.m. and 4:30 p.m.  Voicemails left after 4:00 p.m. will not be returned until the following business day.  For prescription refill requests, have your pharmacy contact our office and allow 72 hours.    Cancer Center Support Programs:   > Cancer Support Group  2nd Tuesday of the month 1pm-2pm, Journey Room

## 2019-03-01 DIAGNOSIS — I10 Essential (primary) hypertension: Secondary | ICD-10-CM | POA: Diagnosis not present

## 2019-03-01 DIAGNOSIS — N183 Chronic kidney disease, stage 3 (moderate): Secondary | ICD-10-CM | POA: Diagnosis not present

## 2019-03-01 DIAGNOSIS — E1121 Type 2 diabetes mellitus with diabetic nephropathy: Secondary | ICD-10-CM | POA: Diagnosis not present

## 2019-03-01 DIAGNOSIS — E875 Hyperkalemia: Secondary | ICD-10-CM | POA: Diagnosis not present

## 2019-03-01 DIAGNOSIS — E7800 Pure hypercholesterolemia, unspecified: Secondary | ICD-10-CM | POA: Diagnosis not present

## 2019-03-01 DIAGNOSIS — E782 Mixed hyperlipidemia: Secondary | ICD-10-CM | POA: Diagnosis not present

## 2019-03-01 DIAGNOSIS — E039 Hypothyroidism, unspecified: Secondary | ICD-10-CM | POA: Diagnosis not present

## 2019-03-05 DIAGNOSIS — N183 Chronic kidney disease, stage 3 (moderate): Secondary | ICD-10-CM | POA: Diagnosis not present

## 2019-03-05 DIAGNOSIS — I10 Essential (primary) hypertension: Secondary | ICD-10-CM | POA: Diagnosis not present

## 2019-03-05 DIAGNOSIS — E1129 Type 2 diabetes mellitus with other diabetic kidney complication: Secondary | ICD-10-CM | POA: Diagnosis not present

## 2019-03-05 DIAGNOSIS — E782 Mixed hyperlipidemia: Secondary | ICD-10-CM | POA: Diagnosis not present

## 2019-03-05 DIAGNOSIS — E039 Hypothyroidism, unspecified: Secondary | ICD-10-CM | POA: Diagnosis not present

## 2019-03-05 DIAGNOSIS — Z6823 Body mass index (BMI) 23.0-23.9, adult: Secondary | ICD-10-CM | POA: Diagnosis not present

## 2019-03-05 DIAGNOSIS — Z0001 Encounter for general adult medical examination with abnormal findings: Secondary | ICD-10-CM | POA: Diagnosis not present

## 2019-03-05 DIAGNOSIS — K5901 Slow transit constipation: Secondary | ICD-10-CM | POA: Diagnosis not present

## 2019-07-11 ENCOUNTER — Other Ambulatory Visit: Payer: Self-pay

## 2019-07-11 ENCOUNTER — Inpatient Hospital Stay (HOSPITAL_COMMUNITY): Payer: Medicare HMO | Attending: Hematology

## 2019-07-11 DIAGNOSIS — Z7982 Long term (current) use of aspirin: Secondary | ICD-10-CM | POA: Insufficient documentation

## 2019-07-11 DIAGNOSIS — E785 Hyperlipidemia, unspecified: Secondary | ICD-10-CM | POA: Diagnosis not present

## 2019-07-11 DIAGNOSIS — K5900 Constipation, unspecified: Secondary | ICD-10-CM | POA: Insufficient documentation

## 2019-07-11 DIAGNOSIS — E039 Hypothyroidism, unspecified: Secondary | ICD-10-CM | POA: Diagnosis not present

## 2019-07-11 DIAGNOSIS — E119 Type 2 diabetes mellitus without complications: Secondary | ICD-10-CM | POA: Insufficient documentation

## 2019-07-11 DIAGNOSIS — I10 Essential (primary) hypertension: Secondary | ICD-10-CM | POA: Diagnosis not present

## 2019-07-11 DIAGNOSIS — C8330 Diffuse large B-cell lymphoma, unspecified site: Secondary | ICD-10-CM | POA: Diagnosis not present

## 2019-07-11 DIAGNOSIS — Z923 Personal history of irradiation: Secondary | ICD-10-CM | POA: Insufficient documentation

## 2019-07-11 DIAGNOSIS — Z7984 Long term (current) use of oral hypoglycemic drugs: Secondary | ICD-10-CM | POA: Diagnosis not present

## 2019-07-11 DIAGNOSIS — Z9221 Personal history of antineoplastic chemotherapy: Secondary | ICD-10-CM | POA: Insufficient documentation

## 2019-07-11 DIAGNOSIS — Z79899 Other long term (current) drug therapy: Secondary | ICD-10-CM | POA: Insufficient documentation

## 2019-07-11 LAB — COMPREHENSIVE METABOLIC PANEL
ALT: 27 U/L (ref 0–44)
AST: 26 U/L (ref 15–41)
Albumin: 4.3 g/dL (ref 3.5–5.0)
Alkaline Phosphatase: 88 U/L (ref 38–126)
Anion gap: 12 (ref 5–15)
BUN: 39 mg/dL — ABNORMAL HIGH (ref 8–23)
CO2: 24 mmol/L (ref 22–32)
Calcium: 9.6 mg/dL (ref 8.9–10.3)
Chloride: 101 mmol/L (ref 98–111)
Creatinine, Ser: 1.76 mg/dL — ABNORMAL HIGH (ref 0.44–1.00)
GFR calc Af Amer: 31 mL/min — ABNORMAL LOW (ref >60)
GFR calc non Af Amer: 26 mL/min — ABNORMAL LOW (ref >60)
Glucose, Bld: 152 mg/dL — ABNORMAL HIGH (ref 70–99)
Potassium: 3.8 mmol/L (ref 3.5–5.1)
Sodium: 137 mmol/L (ref 135–145)
Total Bilirubin: 0.3 mg/dL (ref 0.3–1.2)
Total Protein: 8.1 g/dL (ref 6.5–8.1)

## 2019-07-11 LAB — CBC WITH DIFFERENTIAL/PLATELET
Abs Immature Granulocytes: 0.03 10*3/uL (ref 0.00–0.07)
Basophils Absolute: 0 10*3/uL (ref 0.0–0.1)
Basophils Relative: 0 %
Eosinophils Absolute: 0.2 10*3/uL (ref 0.0–0.5)
Eosinophils Relative: 3 %
HCT: 40.3 % (ref 36.0–46.0)
Hemoglobin: 13 g/dL (ref 12.0–15.0)
Immature Granulocytes: 0 %
Lymphocytes Relative: 27 %
Lymphs Abs: 1.8 10*3/uL (ref 0.7–4.0)
MCH: 29.5 pg (ref 26.0–34.0)
MCHC: 32.3 g/dL (ref 30.0–36.0)
MCV: 91.6 fL (ref 80.0–100.0)
Monocytes Absolute: 0.7 10*3/uL (ref 0.1–1.0)
Monocytes Relative: 10 %
Neutro Abs: 4.1 10*3/uL (ref 1.7–7.7)
Neutrophils Relative %: 60 %
Platelets: 220 10*3/uL (ref 150–400)
RBC: 4.4 MIL/uL (ref 3.87–5.11)
RDW: 15.2 % (ref 11.5–15.5)
WBC: 6.8 10*3/uL (ref 4.0–10.5)
nRBC: 0 % (ref 0.0–0.2)

## 2019-07-11 LAB — LACTATE DEHYDROGENASE: LDH: 139 U/L (ref 98–192)

## 2019-07-18 ENCOUNTER — Inpatient Hospital Stay (HOSPITAL_COMMUNITY): Payer: Medicare HMO | Admitting: Hematology

## 2019-07-18 ENCOUNTER — Other Ambulatory Visit: Payer: Self-pay

## 2019-07-18 ENCOUNTER — Encounter (HOSPITAL_COMMUNITY): Payer: Self-pay | Admitting: Hematology

## 2019-07-18 VITALS — BP 156/63 | HR 69 | Temp 97.9°F | Resp 20 | Wt 135.8 lb

## 2019-07-18 DIAGNOSIS — C8330 Diffuse large B-cell lymphoma, unspecified site: Secondary | ICD-10-CM

## 2019-07-18 MED ORDER — SODIUM CHLORIDE 0.9% FLUSH
10.0000 mL | Freq: Once | INTRAVENOUS | Status: AC
  Administered 2019-07-18: 10 mL

## 2019-07-18 MED ORDER — HEPARIN SOD (PORK) LOCK FLUSH 100 UNIT/ML IV SOLN
500.0000 [IU] | Freq: Once | INTRAVENOUS | Status: AC
  Administered 2019-07-18: 500 [IU] via INTRAVENOUS

## 2019-07-18 NOTE — Patient Instructions (Signed)
[place] at [facility] Discharge Instructions  You were seen today by Dr. Delton Coombes. He went over your recent lab results. Make sure that you are drinking lots of water. He will see you back in 6 months for labs and follow up.   Thank you for choosing [place] at [facility] to provide your oncology and hematology care.  To afford each patient quality time with our provider, please arrive at least 15 minutes before your scheduled appointment time.   If you have a lab appointment with the [place] please come in thru the  Main Entrance and check in at the main information desk  You need to re-schedule your appointment should you arrive 10 or more minutes late.  We strive to give you quality time with our providers, and arriving late affects you and other patients whose appointments are after yours.  Also, if you no show three or more times for appointments you may be dismissed from the clinic at the providers discretion.     Again, thank you for choosing [facility].  Our hope is that these requests will decrease the amount of time that you wait before being seen by our physicians.       _____________________________________________________________  Should you have questions after your visit to [facility], please contact our office at [phone] between the hours of 8:00 a.m. and 4:30 p.m.  Voicemails left after 4:00 p.m. will not be returned until the following business day.  For prescription refill requests, have your pharmacy contact our office and allow 72 hours.    Cancer Center Support Programs:   > Cancer Support Group  2nd Tuesday of the month 1pm-2pm, Journey Room

## 2019-07-18 NOTE — Progress Notes (Signed)
Cristina Gregory, [name] [address]   CLINIC:  Medical Oncology/Hematology  PCP:  Manon Hilding, MD [place] [address] [phone]   REASON FOR VISIT:  Follow-up for large B-cell lymphoma.   BRIEF ONCOLOGIC HISTORY:  Oncology History  DLBCL (diffuse large B cell lymphoma) ([place])  05/06/2015 Initial Diagnosis   DLBCL (diffuse large B cell lymphoma) ([place]).  Diagnosis via right supraclavicular/cervical lymph node biopsy.   05/26/2015 Cancer Staging   Stage IIB   05/26/2015 PET scan   Hypermetabolic right supraclavicular adenopathy. Mild hypermetabolic AP window lymph node. A focal hypermetabolic wall thickening in the mid sigmoid colon, correlate for acute diverticulitis versus mass. No hypermetabolic right lung base nodule, likely too small for evaluation by PET   05/28/2015 Echocardiogram   Left ventricle is normal in size. Mild concentric left ventricular hypertrophy. Left ventricle ejection fraction is normal (60-65 percent). Left ventricular diastolic function is abnormal. There is mild aortic regurgitation.   06/04/2015 -  Chemotherapy   RCHOP, administered by Dr. Tressie Stalker at [facility].  Dose reduction beginning on cycle two.  Due to complications, received 4 cycles of chemotherapy. Her last 2 cycles cancelled.   07/06/2015 Adverse Reaction   Hospitalized with neutropenic fever but negative blood cultures   08/03/2015 Remission   PET scan shows complete remission after 3 cycles of chemotherapy   08/03/2015 PET scan   A near complete resolution of right supraclavicular nodes. No hypermetabolic activity remaining. Stable activity within the left thyroid lobe. Ultrasound for further evaluation if clinically indicated. Stable 3 mm right basilar nodule. Nonspecific activity in the sigmoid colon is increased.   08/20/2015 Adverse Reaction   Hospitalized after cycle 4 with neutropenic fever, thrombocytopenia, and Staphylococcus epidermidis blood  culture positive 1   09/11/2015 - 10/30/2015 Radiation Therapy   36 Gy delivered to areas of previous involvement due to inability to tolerate further chemotherapy.      CANCER STAGING: Cancer Staging DLBCL (diffuse large B cell lymphoma) (HCC) Staging form: Lymphoid Neoplasms, AJCC 6th Edition - Clinical stage from 05/26/2015: Stage II - Signed by Baird Cancer, PA-C on 07/13/2016    INTERVAL HISTORY:  Cristina Gregory 84 y.o. female seen for follow-up of lymphoma.  Denies any fevers, night sweats or weight loss.  Reportedly developed left eye ptosis and Dr. Quintin Alto is doing the work-up.  Appetite and energy levels are 25%.  Has chronic constipation which is stable.  Denies any new onset pains.  Denies any nausea, vomiting, diarrhea or constipation.  No dysphagia noted.   REVIEW OF SYSTEMS:  Review of Systems  Eyes: Positive for eye problems.  Endocrine:       Night sweats   All other systems reviewed and are negative.    PAST MEDICAL/SURGICAL HISTORY:  Past Medical History:  Diagnosis Date   Chronic renal insufficiency    DLBCL (diffuse large B cell lymphoma) ([place]) 07/13/2016   Gout    Hypertension    Hypothyroidism    Past Surgical History:  Procedure Laterality Date   COLONOSCOPY N/A 03/06/2017   Procedure: COLONOSCOPY;  Surgeon: Danie Binder, MD;  Location: [facility];  Service: Endoscopy;  Laterality: N/A;  10:00am   Port-A-Cath placement     SLT LASER APPLICATION Right [id number]   Procedure: SLT LASER APPLICATION;  Surgeon: Williams Che, MD;  Location: [facility] ORS;  Service: Ophthalmology;  Laterality: Right;     SOCIAL HISTORY:  Social History  Socioeconomic History   Marital status: Single    Spouse name: Not on file   Number of children: 4   Years of education: Not on file   Highest education level: Not on file  Occupational History   Not on file  Social Needs   Financial resource strain: Not on file   Food insecurity    Worry:  Not on file    Inability: Not on file   Transportation needs    Medical: Not on file    Non-medical: Not on file  Tobacco Use   Smoking status: Never Smoker   Smokeless tobacco: Never Used  Substance and Sexual Activity   Alcohol use: No   Drug use: No   Sexual activity: Not on file  Lifestyle   Physical activity    Days per week: Not on file    Minutes per session: Not on file   Stress: Not on file  Relationships   Social connections    Talks on phone: Not on file    Gets together: Not on file    Attends religious service: Not on file    Active member of club or organization: Not on file    Attends meetings of clubs or organizations: Not on file    Relationship status: Not on file   Intimate partner violence    Fear of current or ex partner: Not on file    Emotionally abused: Not on file    Physically abused: Not on file    Forced sexual activity: Not on file  Other Topics Concern   Not on file  Social History Narrative   Not on file    FAMILY HISTORY:  Family History  Problem Relation Age of Onset   Colon cancer Mother        Patient states her mother has been: Issues before it could be evaluated she died from heart disease.    CURRENT MEDICATIONS:  Outpatient Encounter Medications as of 07/18/2019  Medication Sig   allopurinol (ZYLOPRIM) 300 MG tablet Take 300 mg by mouth daily at 12 noon.    amLODipine (NORVASC) 5 MG tablet Take 5 mg by mouth daily at 12 noon.    aspirin 81 MG EC tablet Take 81 mg by mouth daily at 12 noon.    clopidogrel (PLAVIX) 75 MG tablet Take 75 mg by mouth daily at 12 noon.    glipiZIDE (GLUCOTROL) 5 MG tablet Take 1 mg by mouth daily.   hydrochlorothiazide (HYDRODIURIL) 12.5 MG tablet Take 12.5 mg by mouth daily at 12 noon.   levothyroxine (SYNTHROID, LEVOTHROID) 88 MCG tablet Take 88 mcg by mouth daily before breakfast.   metoprolol tartrate (LOPRESSOR) 25 MG tablet Take 25 mg by mouth 2 (two) times daily.    polyethylene glycol (MIRALAX / GLYCOLAX) packet Take 17 g by mouth daily.   simvastatin (ZOCOR) 20 MG tablet Take 20 mg by mouth at bedtime.    lidocaine-prilocaine (EMLA) cream Apply 1 application topically as needed (port access).   [EXPIRED] heparin lock flush 100 unit/mL    [EXPIRED] sodium chloride flush (NS) 0.9 % injection 10 mL    No facility-administered encounter medications on file as of 07/18/2019.     ALLERGIES:  No Known Allergies   PHYSICAL EXAM:  ECOG Performance status: 1  Blood pressure is 156/63.  Pulse rate is 69.  Respiratory rate is 20.  Temperature 98.  Saturations are 99.  Physical Exam Constitutional:      Appearance: Normal appearance.  Cardiovascular:     Rate and Rhythm: Normal rate and regular rhythm.     Heart sounds: Normal heart sounds.  Pulmonary:     Effort: Pulmonary effort is normal.     Breath sounds: Normal breath sounds.  Abdominal:     Palpations: Abdomen is soft. There is no mass.  Musculoskeletal:     Left lower leg: No edema.  Lymphadenopathy:     Cervical: No cervical adenopathy.  Skin:    General: Skin is warm.  Neurological:     General: No focal deficit present.     Mental Status: She is alert and oriented to person, place, and time.  Psychiatric:        Mood and Affect: Mood normal.        Behavior: Behavior normal.    No axillary or inguinal adenopathy.  No splenomegaly.  LABORATORY DATA:  I have reviewed the labs as listed.  CBC    Component Value Date/Time   WBC 6.8 07/11/2019 1314   RBC 4.40 07/11/2019 1314   HGB 13.0 07/11/2019 1314   HCT 40.3 07/11/2019 1314   PLT 220 07/11/2019 1314   MCV 91.6 07/11/2019 1314   MCH 29.5 07/11/2019 1314   MCHC 32.3 07/11/2019 1314   RDW 15.2 07/11/2019 1314   LYMPHSABS 1.8 07/11/2019 1314   MONOABS 0.7 07/11/2019 1314   EOSABS 0.2 07/11/2019 1314   BASOSABS 0.0 07/11/2019 1314   CMP Latest Ref Rng & Units 07/11/2019 01/04/2019 09/27/2018  Glucose 70 - 99 mg/dL  152(H) 156(H) 162(H)  BUN 8 - 23 mg/dL 39(H) 28(H) 30(H)  Creatinine 0.44 - 1.00 mg/dL 1.76(H) 1.50(H) 1.51(H)  Sodium 135 - 145 mmol/L 137 138 141  Potassium 3.5 - 5.1 mmol/L 3.8 4.2 3.9  Chloride 98 - 111 mmol/L 101 103 106  CO2 22 - 32 mmol/L 24 24 26   Calcium 8.9 - 10.3 mg/dL 9.6 9.5 9.4  Total Protein 6.5 - 8.1 g/dL 8.1 7.7 7.6  Total Bilirubin 0.3 - 1.2 mg/dL 0.3 0.5 0.4  Alkaline Phos 38 - 126 U/L 88 100 86  AST 15 - 41 U/L 26 20 28   ALT 0 - 44 U/L 27 21 30        DIAGNOSTIC IMAGING:  I have independently reviewed the scans and discussed with the patient.   I have reviewed Venita Lick LPN's note and agree with the documentation.  I personally performed a face-to-face visit, made revisions and my assessment and plan is as follows.    ASSESSMENT & PLAN:   DLBCL (diffuse large B cell lymphoma) (HCC) 1.  Stage IIb diffuse large B-cell lymphoma: - 4 cycles of R-CHOP completed in October 2016, truncated secondary to recurrent infections. - XRT by Dr. Isidore Moos from 09/11/2015 through 10/30/2015. - Denies any fevers, night sweats or weight loss. -Physical exam today did not reveal any lymphadenopathy or splenomegaly. - We reviewed her labs.  LDH and CBC were within normal limits.  Creatinine increased to 1.76. -She will come back in 6 months for follow-up with repeat labs and physical exam.  2.  CKD: -Creatinine increased to 1.76.  She was advised to drink a lot of fluids and avoid NSAIDs.  3.  Left eye ptosis: - Dr. Quintin Alto has done a chest x-ray which was reportedly negative.  She is scheduled for MRI of the brain.      Orders placed this encounter:  Orders Placed This Encounter  Procedures   CBC with Differential/Platelet   Comprehensive metabolic panel  Lactate dehydrogenase      Derek Jack, MD [place] [phone]

## 2019-07-18 NOTE — Assessment & Plan Note (Signed)
1.  Stage IIb diffuse large B-cell lymphoma: - 4 cycles of R-CHOP completed in October 2016, truncated secondary to recurrent infections. - XRT by Dr. Isidore Moos from 09/11/2015 through 10/30/2015. - Denies any fevers, night sweats or weight loss. -Physical exam today did not reveal any lymphadenopathy or splenomegaly. - We reviewed her labs.  LDH and CBC were within normal limits.  Creatinine increased to 1.76. -She will come back in 6 months for follow-up with repeat labs and physical exam.  2.  CKD: -Creatinine increased to 1.76.  She was advised to drink a lot of fluids and avoid NSAIDs.  3.  Left eye ptosis: - Dr. Quintin Alto has done a chest x-ray which was reportedly negative.  She is scheduled for MRI of the brain.

## 2019-08-06 ENCOUNTER — Other Ambulatory Visit: Payer: Self-pay | Admitting: Neurology

## 2019-08-06 ENCOUNTER — Other Ambulatory Visit (HOSPITAL_COMMUNITY): Payer: Self-pay | Admitting: Neurology

## 2019-08-06 DIAGNOSIS — I639 Cerebral infarction, unspecified: Secondary | ICD-10-CM

## 2019-08-14 ENCOUNTER — Other Ambulatory Visit: Payer: Self-pay

## 2019-08-14 ENCOUNTER — Ambulatory Visit (HOSPITAL_COMMUNITY)
Admission: RE | Admit: 2019-08-14 | Discharge: 2019-08-14 | Disposition: A | Discharge status: Home / Self Care | Payer: Medicare HMO | Source: Ambulatory Visit | Attending: Neurology | Admitting: Neurology

## 2019-08-14 DIAGNOSIS — I639 Cerebral infarction, unspecified: Secondary | ICD-10-CM | POA: Diagnosis present

## 2019-12-12 IMAGING — MR MR HEAD W/O CM
6 of 10 series · 26 of 48 positions shown · non-contrast
Comparison: CT head 03/17/2018

CLINICAL DATA: Weakness.  Left eye droop.  History of lymphoma.

EXAM:
MRI HEAD WITHOUT CONTRAST
TECHNIQUE: Multiplanar, multiecho pulse sequences of the brain and surrounding
structures were obtained without intravenous contrast.

[Series 3: DWI · axial · 3.0mm · 0.67mm/px · z∈[-117,+45]mm · 7 of 55 slices shown (1 of 2)]
[im 1/55]
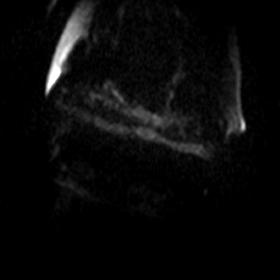
[im 10/55]
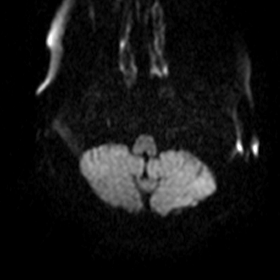
[im 19/55]
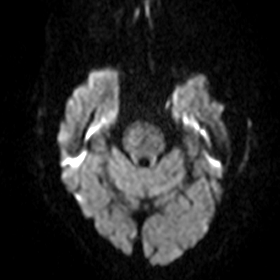
[im 28/55]
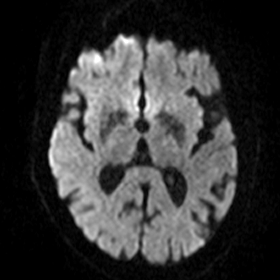
[im 37/55]
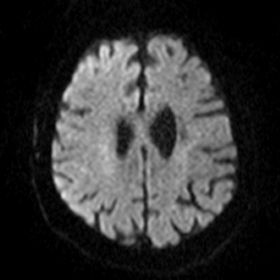
[im 46/55]
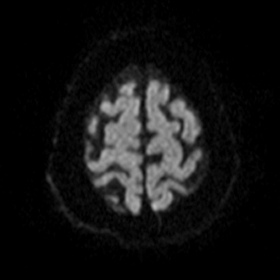
[im 55/55]
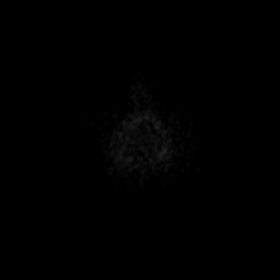

[Series 5: DWI · coronal · 5.0mm · 0.51mm/px · 4 of 38 slices shown (2 of 2)]
[im 1/38]
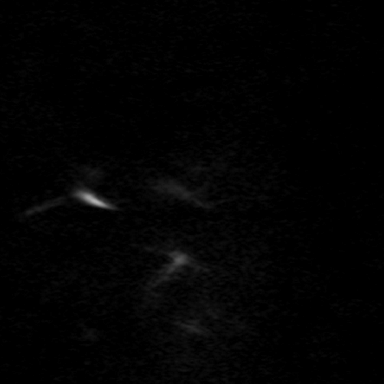
[im 13/38]
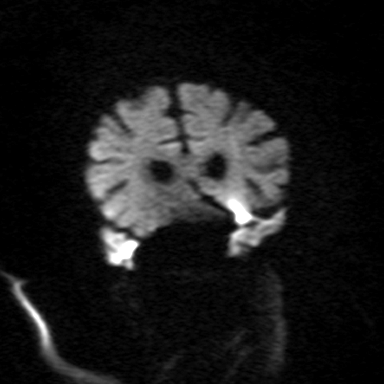
[im 25/38]
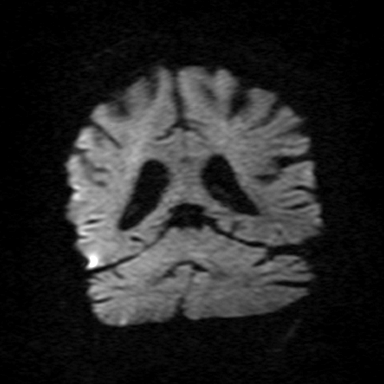
[im 38/38]
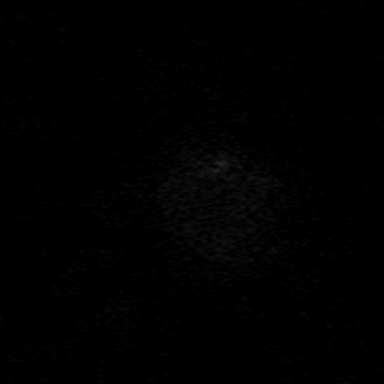

[Series 8: T2 · axial · 5.0mm · 0.47mm/px · z∈[-108,+34]mm · 3 of 23 slices shown (1 of 3)]
[im 1/23]
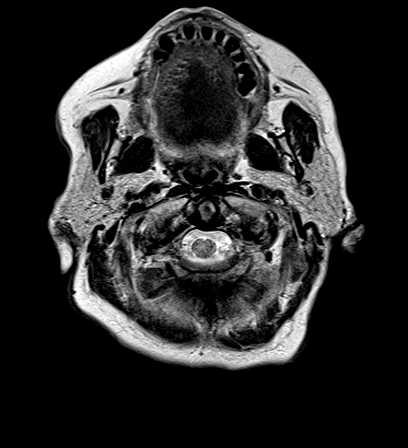
[im 12/23]
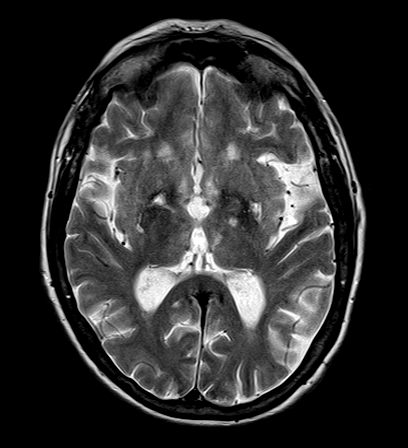
[im 23/23]
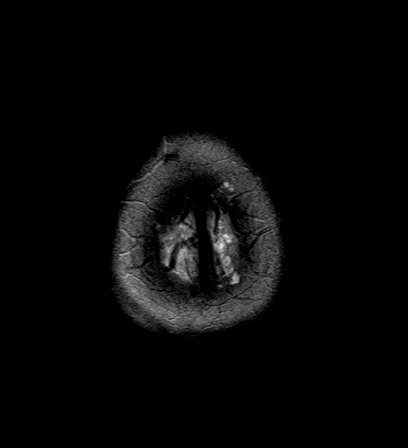

[Series 9: T2 · axial · 4.0mm · 0.41mm/px · z∈[-109,+36]mm · 4 of 30 slices shown (2 of 3)]
[im 1/30]
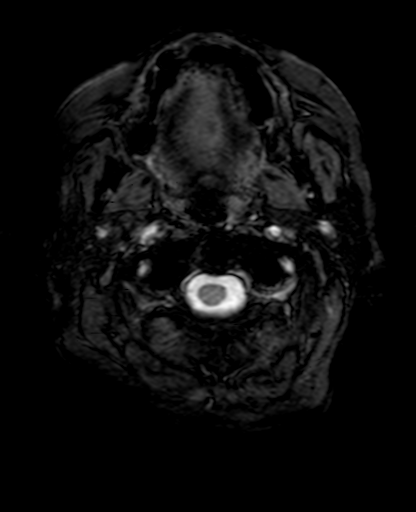
[im 10/30]
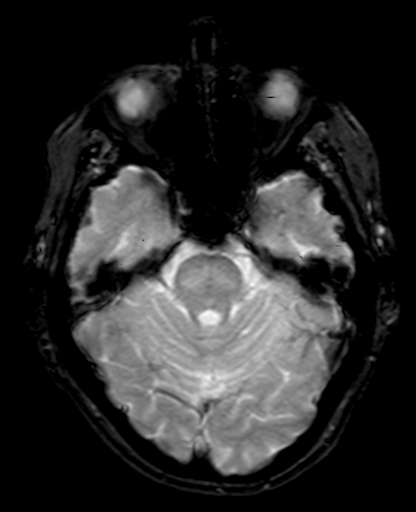
[im 20/30]
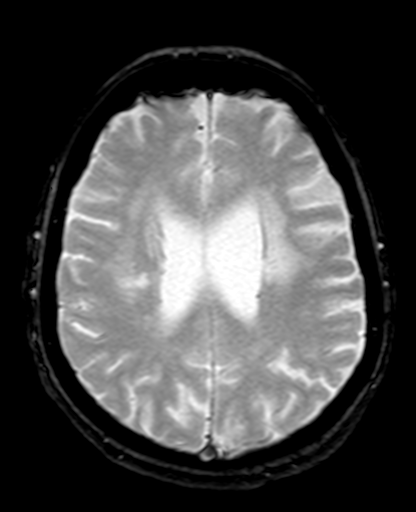
[im 30/30]
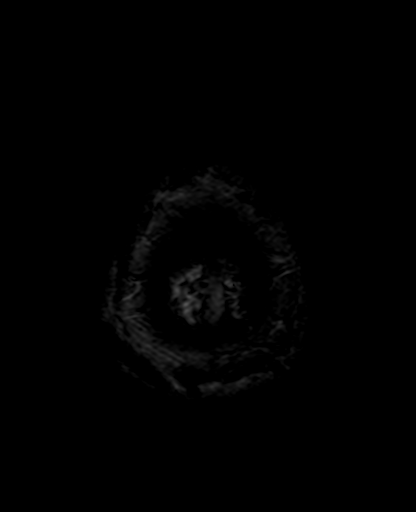

[Series 10: FLAIR · axial · 3.0mm · 0.31mm/px · z∈[-106,+31]mm · 6 of 47 slices shown]
[im 1/47]
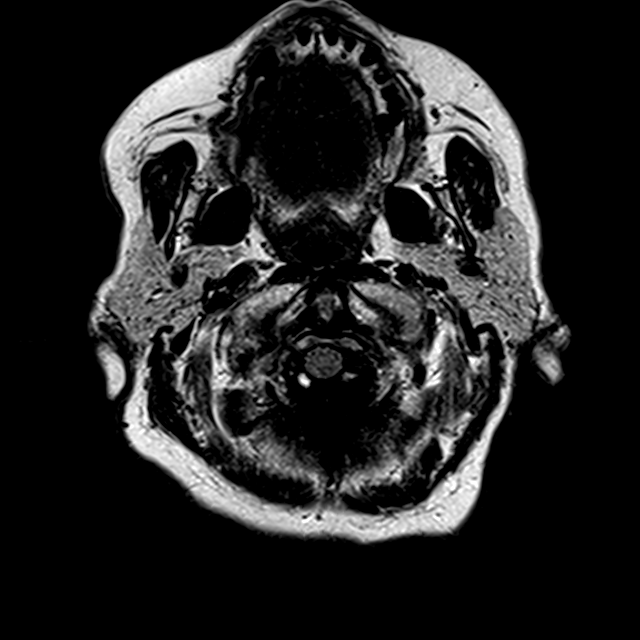
[im 10/47]
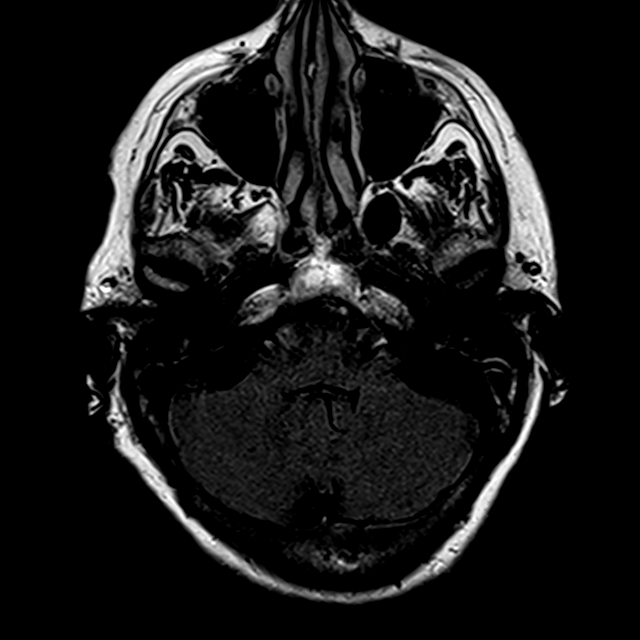
[im 19/47]
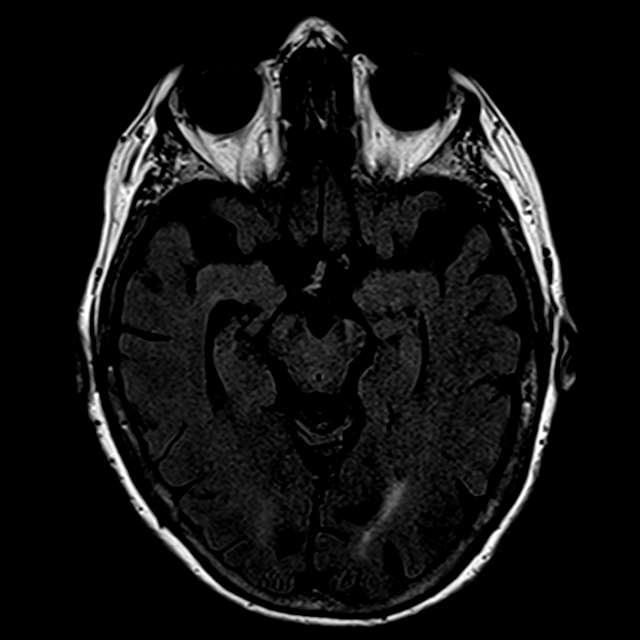
[im 28/47]
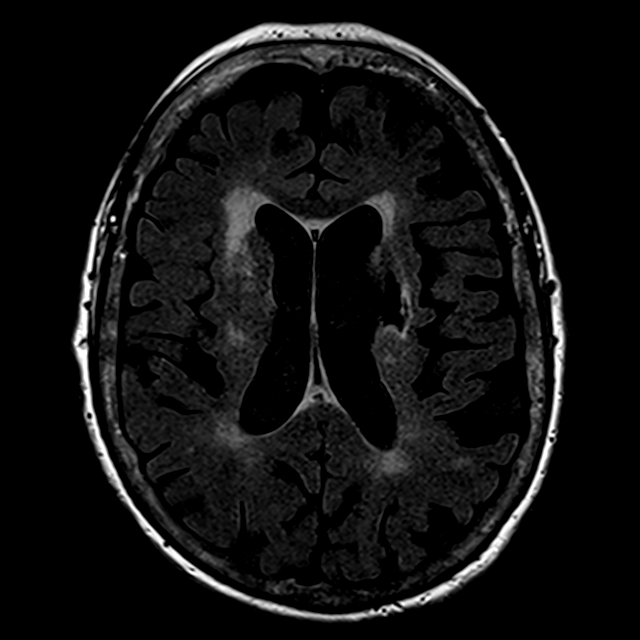
[im 37/47]
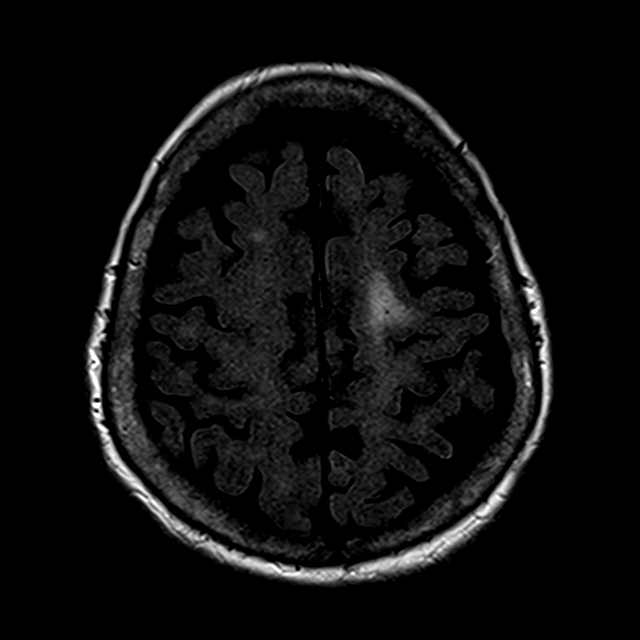
[im 47/47]
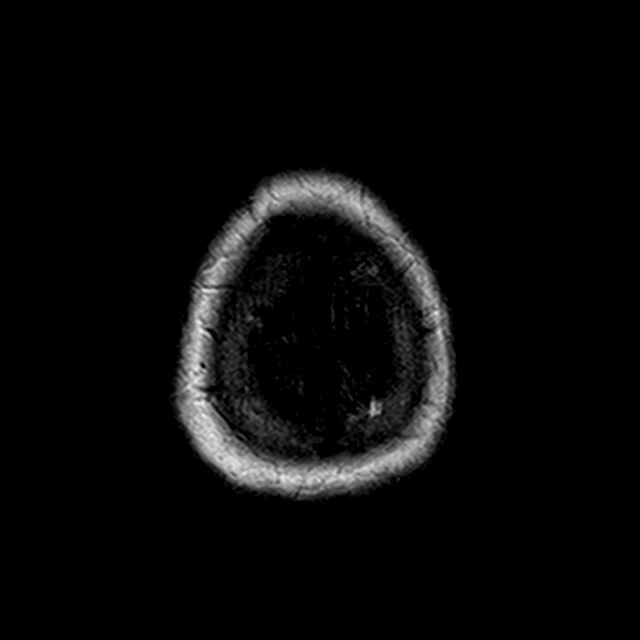

[Series 12: T2 · coronal · 5.0mm · 0.41mm/px · 2 of 28 slices shown (3 of 3)]
[im 1/28]
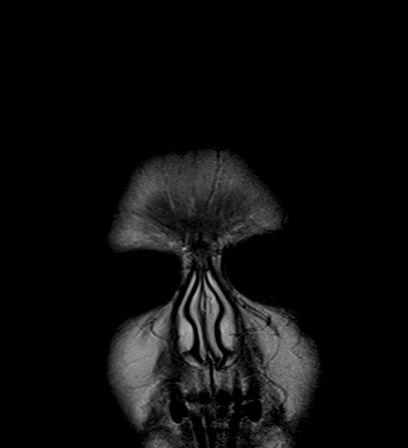
[im 14/28]
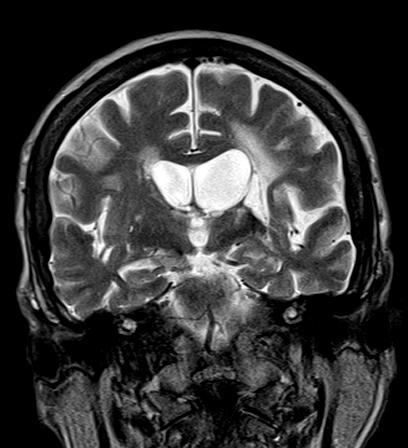

[26 of 48 positions shown; findings below may reference images not displayed]

FINDINGS: Brain: Mild atrophy. Negative for hydrocephalus. Moderate to
extensive chronic ischemic changes in the periventricular deep white
matter bilaterally. Bilateral basal ganglia infarcts unchanged.
Small chronic infarct left thalamus. Chronic ischemia in the pons.

Negative for acute infarct. Negative for mass. Mild chronic
hemorrhage in the basal ganglia lacunar infarcts bilaterally.

Vascular: Normal arterial flow voids

Skull and upper cervical spine: Negative

Sinuses/Orbits: Negative

Other: None
IMPRESSION: Atrophy and chronic ischemic changes, moderate to extensive

No acute abnormality.

## 2020-01-13 ENCOUNTER — Inpatient Hospital Stay (HOSPITAL_COMMUNITY): Payer: Medicare HMO | Attending: Hematology

## 2020-01-13 ENCOUNTER — Other Ambulatory Visit: Payer: Self-pay

## 2020-01-13 DIAGNOSIS — C8331 Diffuse large B-cell lymphoma, lymph nodes of head, face, and neck: Secondary | ICD-10-CM | POA: Insufficient documentation

## 2020-01-13 DIAGNOSIS — Z923 Personal history of irradiation: Secondary | ICD-10-CM | POA: Insufficient documentation

## 2020-01-13 DIAGNOSIS — H02402 Unspecified ptosis of left eyelid: Secondary | ICD-10-CM | POA: Diagnosis not present

## 2020-01-13 DIAGNOSIS — C8330 Diffuse large B-cell lymphoma, unspecified site: Secondary | ICD-10-CM

## 2020-01-13 DIAGNOSIS — Z9221 Personal history of antineoplastic chemotherapy: Secondary | ICD-10-CM | POA: Insufficient documentation

## 2020-01-13 DIAGNOSIS — K5909 Other constipation: Secondary | ICD-10-CM | POA: Insufficient documentation

## 2020-01-13 DIAGNOSIS — N189 Chronic kidney disease, unspecified: Secondary | ICD-10-CM | POA: Insufficient documentation

## 2020-01-13 LAB — CBC WITH DIFFERENTIAL/PLATELET
Abs Immature Granulocytes: 0.03 10*3/uL (ref 0.00–0.07)
Basophils Absolute: 0.1 10*3/uL (ref 0.0–0.1)
Basophils Relative: 1 %
Eosinophils Absolute: 0.3 10*3/uL (ref 0.0–0.5)
Eosinophils Relative: 3 %
HCT: 41.3 % (ref 36.0–46.0)
Hemoglobin: 13.5 g/dL (ref 12.0–15.0)
Immature Granulocytes: 0 %
Lymphocytes Relative: 25 %
Lymphs Abs: 2.2 10*3/uL (ref 0.7–4.0)
MCH: 29.7 pg (ref 26.0–34.0)
MCHC: 32.7 g/dL (ref 30.0–36.0)
MCV: 91 fL (ref 80.0–100.0)
Monocytes Absolute: 0.8 10*3/uL (ref 0.1–1.0)
Monocytes Relative: 8 %
Neutro Abs: 5.8 10*3/uL (ref 1.7–7.7)
Neutrophils Relative %: 63 %
Platelets: 230 10*3/uL (ref 150–400)
RBC: 4.54 MIL/uL (ref 3.87–5.11)
RDW: 14.9 % (ref 11.5–15.5)
WBC: 9.1 10*3/uL (ref 4.0–10.5)
nRBC: 0 % (ref 0.0–0.2)

## 2020-01-13 LAB — COMPREHENSIVE METABOLIC PANEL
ALT: 42 U/L (ref 0–44)
AST: 29 U/L (ref 15–41)
Albumin: 4.1 g/dL (ref 3.5–5.0)
Alkaline Phosphatase: 85 U/L (ref 38–126)
Anion gap: 12 (ref 5–15)
BUN: 58 mg/dL — ABNORMAL HIGH (ref 8–23)
CO2: 27 mmol/L (ref 22–32)
Calcium: 9.8 mg/dL (ref 8.9–10.3)
Chloride: 98 mmol/L (ref 98–111)
Creatinine, Ser: 1.72 mg/dL — ABNORMAL HIGH (ref 0.44–1.00)
GFR calc Af Amer: 32 mL/min — ABNORMAL LOW (ref >60)
GFR calc non Af Amer: 27 mL/min — ABNORMAL LOW (ref >60)
Glucose, Bld: 181 mg/dL — ABNORMAL HIGH (ref 70–99)
Potassium: 3.4 mmol/L — ABNORMAL LOW (ref 3.5–5.1)
Sodium: 137 mmol/L (ref 135–145)
Total Bilirubin: 0.5 mg/dL (ref 0.3–1.2)
Total Protein: 7.9 g/dL (ref 6.5–8.1)

## 2020-01-13 LAB — LACTATE DEHYDROGENASE: LDH: 106 U/L (ref 98–192)

## 2020-01-20 ENCOUNTER — Encounter (HOSPITAL_COMMUNITY): Payer: Self-pay | Admitting: Hematology

## 2020-01-20 ENCOUNTER — Other Ambulatory Visit: Payer: Self-pay

## 2020-01-20 ENCOUNTER — Inpatient Hospital Stay (HOSPITAL_BASED_OUTPATIENT_CLINIC_OR_DEPARTMENT_OTHER): Payer: Medicare HMO | Admitting: Hematology

## 2020-01-20 VITALS — BP 152/58 | HR 61 | Temp 97.1°F | Resp 18 | Wt 123.0 lb

## 2020-01-20 DIAGNOSIS — C8331 Diffuse large B-cell lymphoma, lymph nodes of head, face, and neck: Secondary | ICD-10-CM | POA: Diagnosis not present

## 2020-01-20 DIAGNOSIS — C8330 Diffuse large B-cell lymphoma, unspecified site: Secondary | ICD-10-CM | POA: Diagnosis not present

## 2020-01-20 NOTE — Patient Instructions (Addendum)
[facility] at [facility] Discharge Instructions  You were seen today by Dr. Katragadda. He went over your recent lab results. He will see you back in 6 months for labs and follow up.   Thank you for choosing  Can[facility] Center at [facility] to provide your oncology and hematology care.  To afford each patient quality time with our provider, please arrive at least 15 minutes before your scheduled appointment time.   If you have a lab appointment with the Cancer Center please come in thru the  Main Entrance and check in at the main information desk  You need to re-schedule your appointment should you arrive 10 or more minutes late.  We strive to give you quality time with our providers, and arriving late affects you and other patients whose appointments are after yours.  Also, if you no show three or more times for appointments you may be dismissed from the clinic at the providers discretion.     Again, thank you for choosing [facility].  Our hope is that these requests will decrease the amount of time that you wait before being seen by our physicians.       _____________________________________________________________  Should you have questions after your visit to [facility], please contact our office at [phone] between the hours of 8:00 a.m. and 4:30 p.m.  Voicemails left after 4:00 p.m. will not be returned until the following business day.  For prescription refill requests, have your pharmacy contact our office and allow 72 hours.    Cancer Center Support Programs:   > Cancer Support Group  2nd Tuesday of the month 1pm-2pm, Journey Room    

## 2020-01-20 NOTE — Assessment & Plan Note (Signed)
1.  Stage IIb diffuse large B-cell lymphoma: -4 cycles of R-CHOP completed on October 2016, truncated secondary to recurrent infections. -XRT by Dr. Isidore Moos from 09/11/2015 through 10/30/2015. -Denies any fevers, night sweats in the last 6 months.  She lost some weight but gained back most of it. -We reviewed her labs which showed normal LDH and normal CBC. -Physical exam today did not reveal any palpable adenopathy or splenomegaly. -We will see her back in 6 months for follow-up.  2.  CKD: -Creatinine is stable around 1.72.  She was advised to drink a lot of fluids.  3.  Left eye ptosis: -Dr. Quintin Alto did a chest x-ray which was reportedly negative. -MRI of the brain on 08/14/2019 shows atrophy and chronic ischemic changes moderate. -Her left eye ptosis has improved since then.

## 2020-01-20 NOTE — Progress Notes (Signed)
[place] [place], [place] [address]   CLINIC:  Medical Oncology/Hematology  PCP:  Manon Hilding, MD [place] [address] [phone]   REASON FOR VISIT:  Follow-up for large B-cell lymphoma.  CURRENT THERAPY: Observation.  BRIEF ONCOLOGIC HISTORY:  Oncology History  DLBCL (diffuse large B cell lymphoma) ([place])  05/06/2015 Initial Diagnosis   DLBCL (diffuse large B cell lymphoma) ([place]).  Diagnosis via right supraclavicular/cervical lymph node biopsy.   05/26/2015 Cancer Staging   Stage IIB   05/26/2015 PET scan   Hypermetabolic right supraclavicular adenopathy. Mild hypermetabolic AP window lymph node. A focal hypermetabolic wall thickening in the mid sigmoid colon, correlate for acute diverticulitis versus mass. No hypermetabolic right lung base nodule, likely too small for evaluation by PET   05/28/2015 Echocardiogram   Left ventricle is normal in size. Mild concentric left ventricular hypertrophy. Left ventricle ejection fraction is normal (60-65 percent). Left ventricular diastolic function is abnormal. There is mild aortic regurgitation.   06/04/2015 -  Chemotherapy   RCHOP, administered by Dr. Tressie Stalker at [facility].  Dose reduction beginning on cycle two.  Due to complications, received 4 cycles of chemotherapy. Her last 2 cycles cancelled.   07/06/2015 Adverse Reaction   Hospitalized with neutropenic fever but negative blood cultures   08/03/2015 Remission   PET scan shows complete remission after 3 cycles of chemotherapy   08/03/2015 PET scan   A near complete resolution of right supraclavicular nodes. No hypermetabolic activity remaining. Stable activity within the left thyroid lobe. Ultrasound for further evaluation if clinically indicated. Stable 3 mm right basilar nodule. Nonspecific activity in the sigmoid colon is increased.   08/20/2015 Adverse Reaction   Hospitalized after cycle 4 with neutropenic fever, thrombocytopenia, and  Staphylococcus epidermidis blood culture positive 1   09/11/2015 - 10/30/2015 Radiation Therapy   36 Gy delivered to areas of previous involvement due to inability to tolerate further chemotherapy.      CANCER STAGING: Cancer Staging DLBCL (diffuse large B cell lymphoma) (HCC) Staging form: Lymphoid Neoplasms, AJCC 6th Edition - Clinical stage from 05/26/2015: Stage II - Signed by Baird Cancer, PA-C on 07/13/2016    INTERVAL HISTORY:  Cristina Gregory 85 y.o. female seen for follow-up of large B-cell lymphoma.  Denies any fevers or night sweats.  She lost some weight in the last few months and has regained most of it.  Reports appetite of 50%.  Energy levels are 25%.  Chronic constipation has been stable.  Underwent MRI of the brain and carotid Dopplers for work-up of left eye ptosis.  REVIEW OF SYSTEMS:  Review of Systems  Gastrointestinal: Positive for constipation.  All other systems reviewed and are negative.    PAST MEDICAL/SURGICAL HISTORY:  Past Medical History:  Diagnosis Date  . Chronic renal insufficiency   . DLBCL (diffuse large B cell lymphoma) ([place]) 07/13/2016  . Gout   . Hypertension   . Hypothyroidism    Past Surgical History:  Procedure Laterality Date  . COLONOSCOPY N/A 03/06/2017   Procedure: COLONOSCOPY;  Surgeon: Danie Binder, MD;  [facility];  Service: Endoscopy;  Laterality: N/A;  10:00am  . Port-A-Cath placement    . SLT LASER APPLICATION Right [id number]   Procedure: SLT LASER APPLICATION;  Surgeon: Williams Che, MD;  Location: [facility] ORS;  Service: Ophthalmology;  Laterality: Right;     SOCIAL HISTORY:  Social History   Socioeconomic History  . Marital status: Single  Spouse name: Not on file  . Number of children: 4  . Years of education: Not on file  . Highest education level: Not on file  Occupational History  . Not on file  Tobacco Use  . Smoking status: Never Smoker  . Smokeless tobacco: Never Used  Substance and  Sexual Activity  . Alcohol use: No  . Drug use: No  . Sexual activity: Not on file  Other Topics Concern  . Not on file  Social History Narrative  . Not on file   Social Determinants of Health   Financial Resource Strain:   . Difficulty of Paying Living Expenses:   Food Insecurity:   . Worried About Charity fundraiser in the Last Year:   . Arboriculturist in the Last Year:   Transportation Needs:   . Film/video editor (Medical):   Marland Kitchen Lack of Transportation (Non-Medical):   Physical Activity:   . Days of Exercise per Week:   . Minutes of Exercise per Session:   Stress:   . Feeling of Stress :   Social Connections:   . Frequency of Communication with Friends and Family:   . Frequency of Social Gatherings with Friends and Family:   . Attends Religious Services:   . Active Member of Clubs or Organizations:   . Attends Archivist Meetings:   Marland Kitchen Marital Status:   Intimate Partner Violence:   . Fear of Current or Ex-Partner:   . Emotionally Abused:   Marland Kitchen Physically Abused:   . Sexually Abused:     FAMILY HISTORY:  Family History  Problem Relation Age of Onset  . Colon cancer Mother        Patient states her mother has been: Issues before it could be evaluated she died from heart disease.    CURRENT MEDICATIONS:  Outpatient Encounter Medications as of 01/20/2020  Medication Sig  . allopurinol (ZYLOPRIM) 300 MG tablet Take 300 mg by mouth daily at 12 noon.   Marland Kitchen amLODipine (NORVASC) 5 MG tablet Take 5 mg by mouth daily at 12 noon.   Marland Kitchen aspirin 81 MG EC tablet Take 81 mg by mouth daily at 12 noon.   . clopidogrel (PLAVIX) 75 MG tablet Take 75 mg by mouth daily at 12 noon.   Marland Kitchen glipiZIDE (GLUCOTROL) 5 MG tablet Take 1 mg by mouth daily.  . hydrochlorothiazide (HYDRODIURIL) 12.5 MG tablet Take 12.5 mg by mouth daily at 12 noon.  Marland Kitchen levothyroxine (SYNTHROID, LEVOTHROID) 88 MCG tablet Take 88 mcg by mouth daily before breakfast.  . metoprolol tartrate (LOPRESSOR) 25 MG  tablet Take 25 mg by mouth 2 (two) times daily.  . simvastatin (ZOCOR) 20 MG tablet Take 20 mg by mouth at bedtime.   . lidocaine-prilocaine (EMLA) cream Apply 1 application topically as needed (port access).  . polyethylene glycol (MIRALAX / GLYCOLAX) packet Take 17 g by mouth daily as needed.    No facility-administered encounter medications on file as of 01/20/2020.    ALLERGIES:  No Known Allergies   PHYSICAL EXAM:  ECOG Performance status: 2  Vitals:   01/20/20 1507  BP: (!) 152/58  Pulse: 61  Resp: 18  Temp: (!) 97.1 F (36.2 C)  SpO2: 99%   Filed Weights   01/20/20 1507  Weight: 123 lb (55.8 kg)    Physical Exam Vitals reviewed.  Constitutional:      Appearance: Normal appearance.  Cardiovascular:     Rate and Rhythm: Normal rate and regular  rhythm.     Heart sounds: Normal heart sounds.  Pulmonary:     Effort: Pulmonary effort is normal.     Breath sounds: Normal breath sounds.  Abdominal:     General: There is no distension.     Palpations: Abdomen is soft. There is no mass.  Skin:    General: Skin is warm.  Neurological:     General: No focal deficit present.     Mental Status: She is alert and oriented to person, place, and time.  Psychiatric:        Mood and Affect: Mood normal.        Behavior: Behavior normal.      LABORATORY DATA:  I have reviewed the labs as listed.  CBC    Component Value Date/Time   WBC 9.1 01/13/2020 1320   RBC 4.54 01/13/2020 1320   HGB 13.5 01/13/2020 1320   HCT 41.3 01/13/2020 1320   PLT 230 01/13/2020 1320   MCV 91.0 01/13/2020 1320   MCH 29.7 01/13/2020 1320   MCHC 32.7 01/13/2020 1320   RDW 14.9 01/13/2020 1320   LYMPHSABS 2.2 01/13/2020 1320   MONOABS 0.8 01/13/2020 1320   EOSABS 0.3 01/13/2020 1320   BASOSABS 0.1 01/13/2020 1320   CMP Latest Ref Rng & Units 01/13/2020 07/11/2019 01/04/2019  Glucose 70 - 99 mg/dL 181(H) 152(H) 156(H)  BUN 8 - 23 mg/dL 58(H) 39(H) 28(H)  Creatinine 0.44 - 1.00 mg/dL  1.72(H) 1.76(H) 1.50(H)  Sodium 135 - 145 mmol/L 137 137 138  Potassium 3.5 - 5.1 mmol/L 3.4(L) 3.8 4.2  Chloride 98 - 111 mmol/L 98 101 103  CO2 22 - 32 mmol/L 27 24 24   Calcium 8.9 - 10.3 mg/dL 9.8 9.6 9.5  Total Protein 6.5 - 8.1 g/dL 7.9 8.1 7.7  Total Bilirubin 0.3 - 1.2 mg/dL 0.5 0.3 0.5  Alkaline Phos 38 - 126 U/L 85 88 100  AST 15 - 41 U/L 29 26 20   ALT 0 - 44 U/L 42 27 21       DIAGNOSTIC IMAGING:  I have independently reviewed the scans and discussed with the patient.    ASSESSMENT & PLAN:   DLBCL (diffuse large B cell lymphoma) (HCC) 1.  Stage IIb diffuse large B-cell lymphoma: -4 cycles of R-CHOP completed on October 2016, truncated secondary to recurrent infections. -XRT by Dr. Isidore Moos from 09/11/2015 through 10/30/2015. -Denies any fevers, night sweats in the last 6 months.  She lost some weight but gained back most of it. -We reviewed her labs which showed normal LDH and normal CBC. -Physical exam today did not reveal any palpable adenopathy or splenomegaly. -We will see her back in 6 months for follow-up.  2.  CKD: -Creatinine is stable around 1.72.  She was advised to drink a lot of fluids.  3.  Left eye ptosis: -Dr. Quintin Alto did a chest x-ray which was reportedly negative. -MRI of the brain on 08/14/2019 shows atrophy and chronic ischemic changes moderate. -Her left eye ptosis has improved since then.      Orders placed this encounter:  Orders Placed This Encounter  Procedures  . CBC with Differential  . Comprehensive metabolic panel  . Lactate dehydrogenase      Derek Jack, MD [place] [phone]

## 2020-07-22 ENCOUNTER — Other Ambulatory Visit: Payer: Self-pay

## 2020-07-22 ENCOUNTER — Inpatient Hospital Stay (HOSPITAL_COMMUNITY): Payer: Medicare HMO

## 2020-07-22 ENCOUNTER — Inpatient Hospital Stay (HOSPITAL_COMMUNITY): Payer: Medicare HMO | Attending: Hematology | Admitting: Hematology

## 2020-07-22 VITALS — BP 165/55 | HR 65 | Temp 97.1°F | Resp 18 | Wt 123.8 lb

## 2020-07-22 DIAGNOSIS — Z9221 Personal history of antineoplastic chemotherapy: Secondary | ICD-10-CM | POA: Insufficient documentation

## 2020-07-22 DIAGNOSIS — C8330 Diffuse large B-cell lymphoma, unspecified site: Secondary | ICD-10-CM

## 2020-07-22 DIAGNOSIS — C8331 Diffuse large B-cell lymphoma, lymph nodes of head, face, and neck: Secondary | ICD-10-CM | POA: Insufficient documentation

## 2020-07-22 DIAGNOSIS — N189 Chronic kidney disease, unspecified: Secondary | ICD-10-CM | POA: Diagnosis not present

## 2020-07-22 DIAGNOSIS — Z923 Personal history of irradiation: Secondary | ICD-10-CM | POA: Diagnosis not present

## 2020-07-22 DIAGNOSIS — H02402 Unspecified ptosis of left eyelid: Secondary | ICD-10-CM | POA: Insufficient documentation

## 2020-07-22 DIAGNOSIS — I129 Hypertensive chronic kidney disease with stage 1 through stage 4 chronic kidney disease, or unspecified chronic kidney disease: Secondary | ICD-10-CM | POA: Insufficient documentation

## 2020-07-22 DIAGNOSIS — R531 Weakness: Secondary | ICD-10-CM | POA: Insufficient documentation

## 2020-07-22 LAB — COMPREHENSIVE METABOLIC PANEL
ALT: 17 U/L (ref 0–44)
AST: 20 U/L (ref 15–41)
Albumin: 4 g/dL (ref 3.5–5.0)
Alkaline Phosphatase: 59 U/L (ref 38–126)
Anion gap: 12 (ref 5–15)
BUN: 58 mg/dL — ABNORMAL HIGH (ref 8–23)
CO2: 25 mmol/L (ref 22–32)
Calcium: 9.3 mg/dL (ref 8.9–10.3)
Chloride: 100 mmol/L (ref 98–111)
Creatinine, Ser: 1.91 mg/dL — ABNORMAL HIGH (ref 0.44–1.00)
GFR calc Af Amer: 28 mL/min — ABNORMAL LOW (ref >60)
GFR calc non Af Amer: 24 mL/min — ABNORMAL LOW (ref >60)
Glucose, Bld: 294 mg/dL — ABNORMAL HIGH (ref 70–99)
Potassium: 3.3 mmol/L — ABNORMAL LOW (ref 3.5–5.1)
Sodium: 137 mmol/L (ref 135–145)
Total Bilirubin: 0.7 mg/dL (ref 0.3–1.2)
Total Protein: 7.3 g/dL (ref 6.5–8.1)

## 2020-07-22 LAB — CBC WITH DIFFERENTIAL/PLATELET
Abs Immature Granulocytes: 0.02 10*3/uL (ref 0.00–0.07)
Basophils Absolute: 0.1 10*3/uL (ref 0.0–0.1)
Basophils Relative: 1 %
Eosinophils Absolute: 0.1 10*3/uL (ref 0.0–0.5)
Eosinophils Relative: 2 %
HCT: 36.6 % (ref 36.0–46.0)
Hemoglobin: 12 g/dL (ref 12.0–15.0)
Immature Granulocytes: 0 %
Lymphocytes Relative: 26 %
Lymphs Abs: 2 10*3/uL (ref 0.7–4.0)
MCH: 30.5 pg (ref 26.0–34.0)
MCHC: 32.8 g/dL (ref 30.0–36.0)
MCV: 93.1 fL (ref 80.0–100.0)
Monocytes Absolute: 0.6 10*3/uL (ref 0.1–1.0)
Monocytes Relative: 8 %
Neutro Abs: 5 10*3/uL (ref 1.7–7.7)
Neutrophils Relative %: 63 %
Platelets: 195 10*3/uL (ref 150–400)
RBC: 3.93 MIL/uL (ref 3.87–5.11)
RDW: 14.8 % (ref 11.5–15.5)
WBC: 7.7 10*3/uL (ref 4.0–10.5)
nRBC: 0 % (ref 0.0–0.2)

## 2020-07-22 LAB — LACTATE DEHYDROGENASE: LDH: 100 U/L (ref 98–192)

## 2020-07-22 MED ORDER — SODIUM CHLORIDE 0.9% FLUSH
10.0000 mL | INTRAVENOUS | Status: DC | PRN
  Administered 2020-07-22: 10 mL via INTRAVENOUS

## 2020-07-22 MED ORDER — HEPARIN SOD (PORK) LOCK FLUSH 100 UNIT/ML IV SOLN
500.0000 [IU] | Freq: Once | INTRAVENOUS | Status: AC
  Administered 2020-07-22: 500 [IU] via INTRAVENOUS

## 2020-07-22 NOTE — Progress Notes (Signed)
[place], [place] [address]   CLINIC:  Medical Oncology/Hematology  PCP:  Manon Hilding, MD [address] [place] [address]  [phone]  REASON FOR VISIT:  Follow-up for DLBCL  PRIOR THERAPY: R-CHOP x 4 cycles through 07/2015  CURRENT THERAPY: Observation  INTERVAL HISTORY:  Ms. Cristina Gregory, a 85 y.o. female, returns for routine follow-up for her DLBCL. Deliana was last seen on 01/20/2020.  Today she reports feeling weak and is not able to stand anymore; if she tries to do anything physical, then her back starts hurting. She denies having F/C, night sweats, or unexplained weight loss, and denies feeling any abnormalities in her abdomen.  She received her flu vaccine on 9/16.  REVIEW OF SYSTEMS:  Review of Systems  Constitutional: Positive for appetite change (75%) and fatigue (depleted). Negative for chills, diaphoresis, fever and unexpected weight change.  Gastrointestinal: Positive for constipation.  Genitourinary: Positive for bladder incontinence.   Neurological: Positive for headaches.  All other systems reviewed and are negative.   PAST MEDICAL/SURGICAL HISTORY:  Past Medical History:  Diagnosis Date   Chronic renal insufficiency    DLBCL (diffuse large B cell lymphoma) ([place]) 07/13/2016   Gout    Hypertension    Hypothyroidism    Past Surgical History:  Procedure Laterality Date   COLONOSCOPY N/A 03/06/2017   Procedure: COLONOSCOPY;  Surgeon: Danie Binder, MD;  [facility];  Service: Endoscopy;  Laterality: N/A;  10:00am   Port-A-Cath placement     SLT LASER APPLICATION Right 04/20/3150   Procedure: SLT LASER APPLICATION;  Surgeon: Williams Che, MD;  Location: [facility] ORS;  Service: Ophthalmology;  Laterality: Right;    SOCIAL HISTORY:  Social History   Socioeconomic History   Marital status: Single    Spouse name: Not on file   Number of children: 4   Years of education: Not on  file   Highest education level: Not on file  Occupational History   Not on file  Tobacco Use   Smoking status: Never Smoker   Smokeless tobacco: Never Used  Vaping Use   Vaping Use: Never used  Substance and Sexual Activity   Alcohol use: No   Drug use: No   Sexual activity: Not on file  Other Topics Concern   Not on file  Social History Narrative   Not on file   Social Determinants of Health   Financial Resource Strain:    Difficulty of Paying Living Expenses: Not on file  Food Insecurity:    Worried About [place] in the Last Year: Not on file   Ran Out of Food in the Last Year: Not on file  Transportation Needs:    Lack of Transportation (Medical): Not on file   Lack of Transportation (Non-Medical): Not on file  Physical Activity:    Days of Exercise per Week: Not on file   Minutes of Exercise per Session: Not on file  Stress:    Feeling of Stress : Not on file  Social Connections:    Frequency of Communication with Friends and Family: Not on file   Frequency of Social Gatherings with Friends and Family: Not on file   Attends Religious Services: Not on file   Active Member of Clubs or Organizations: Not on file   Attends Archivist Meetings: Not on file   Marital Status: Not on file  Intimate Partner Violence:  Fear of Current or Ex-Partner: Not on file   Emotionally Abused: Not on file   Physically Abused: Not on file   Sexually Abused: Not on file    FAMILY HISTORY:  Family History  Problem Relation Age of Onset   Colon cancer Mother        Patient states her mother has been: Issues before it could be evaluated she died from heart disease.    CURRENT MEDICATIONS:  Current Outpatient Medications  Medication Sig Dispense Refill   allopurinol (ZYLOPRIM) 300 MG tablet Take 300 mg by mouth daily at 12 noon.      amLODipine (NORVASC) 5 MG tablet Take 5 mg by mouth daily at 12 noon.      aspirin 81 MG EC  tablet Take 81 mg by mouth daily at 12 noon.      clopidogrel (PLAVIX) 75 MG tablet Take 75 mg by mouth daily at 12 noon.      glipiZIDE (GLUCOTROL) 5 MG tablet Take 1 mg by mouth daily.     hydrochlorothiazide (HYDRODIURIL) 12.5 MG tablet Take 12.5 mg by mouth daily at 12 noon. 1/2 pill once daily     levothyroxine (SYNTHROID, LEVOTHROID) 88 MCG tablet Take 88 mcg by mouth daily before breakfast.     lidocaine-prilocaine (EMLA) cream Apply 1 application topically as needed (port access).     metoprolol tartrate (LOPRESSOR) 25 MG tablet Take 25 mg by mouth 2 (two) times daily.     polyethylene glycol (MIRALAX / GLYCOLAX) packet Take 17 g by mouth daily as needed.      simvastatin (ZOCOR) 20 MG tablet Take 20 mg by mouth at bedtime.      Current Facility-Administered Medications  Medication Dose Route Frequency Provider Last Rate Last Admin   sodium chloride flush (NS) 0.9 % injection 10 mL  10 mL Intravenous PRN Derek Jack, MD   10 mL at 07/22/20 1442    ALLERGIES:  No Known Allergies  PHYSICAL EXAM:  Performance status (ECOG): 2 - Symptomatic, <50% confined to bed  Vitals:   07/22/20 1422  BP: (!) 165/55  Pulse: 65  Resp: 18  Temp: (!) 97.1 F (36.2 C)  SpO2: 99%   Wt Readings from Last 3 Encounters:  07/22/20 123 lb 12.8 oz (56.2 kg)  01/20/20 123 lb (55.8 kg)  07/18/19 135 lb 12.8 oz (61.6 kg)   Physical Exam Vitals reviewed.  Constitutional:      Appearance: Normal appearance.  Cardiovascular:     Rate and Rhythm: Normal rate and regular rhythm.     Pulses: Normal pulses.     Heart sounds: Normal heart sounds.  Pulmonary:     Effort: Pulmonary effort is normal.     Breath sounds: Normal breath sounds.  Abdominal:     Palpations: Abdomen is soft. There is no mass.     Tenderness: There is no abdominal tenderness.  Musculoskeletal:     Right lower leg: No edema.     Left lower leg: No edema.  Neurological:     General: No focal deficit  present.     Mental Status: She is alert and oriented to person, place, and time.  Psychiatric:        Mood and Affect: Mood normal.        Behavior: Behavior normal.     LABORATORY DATA:  I have reviewed the labs as listed.  CBC Latest Ref Rng & Units 07/22/2020 01/13/2020 07/11/2019  WBC 4.0 - 10.5 K/uL 7.7  9.1 6.8  Hemoglobin 12.0 - 15.0 g/dL 12.0 13.5 13.0  Hematocrit 36 - 46 % 36.6 41.3 40.3  Platelets 150 - 400 K/uL 195 230 220   CMP Latest Ref Rng & Units 07/22/2020 01/13/2020 07/11/2019  Glucose 70 - 99 mg/dL 294(H) 181(H) 152(H)  BUN 8 - 23 mg/dL 58(H) 58(H) 39(H)  Creatinine 0.44 - 1.00 mg/dL 1.91(H) 1.72(H) 1.76(H)  Sodium 135 - 145 mmol/L 137 137 137  Potassium 3.5 - 5.1 mmol/L 3.3(L) 3.4(L) 3.8  Chloride 98 - 111 mmol/L 100 98 101  CO2 22 - 32 mmol/L 25 27 24   Calcium 8.9 - 10.3 mg/dL 9.3 9.8 9.6  Total Protein 6.5 - 8.1 g/dL 7.3 7.9 8.1  Total Bilirubin 0.3 - 1.2 mg/dL 0.7 0.5 0.3  Alkaline Phos 38 - 126 U/L 59 85 88  AST 15 - 41 U/L 20 29 26   ALT 0 - 44 U/L 17 42 27      Component Value Date/Time   RBC 3.93 07/22/2020 1347   MCV 93.1 07/22/2020 1347   MCH 30.5 07/22/2020 1347   MCHC 32.8 07/22/2020 1347   RDW 14.8 07/22/2020 1347   LYMPHSABS 2.0 07/22/2020 1347   MONOABS 0.6 07/22/2020 1347   EOSABS 0.1 07/22/2020 1347   BASOSABS 0.1 07/22/2020 1347   Lab Results  Component Value Date   LDH 100 07/22/2020   LDH 106 01/13/2020   LDH 139 07/11/2019    DIAGNOSTIC IMAGING:  I have independently reviewed the scans and discussed with the patient. No results found.   ASSESSMENT:  1.  Stage IIb diffuse large B-cell lymphoma: -4 cycles of R-CHOP completed on October 2016, truncated secondary to recurrent infections. -XRT by Dr. Isidore Moos from 09/11/2015 through 10/30/2015.  2.  CKD: -Creatinine between 1.7-2.0.  3.  Left eye ptosis: -Dr. Quintin Alto did a chest x-ray which was reportedly negative. -MRI of the brain on 08/14/2019 shows atrophy and chronic  ischemic changes moderate. -Her left eye ptosis has improved since then.   PLAN:  1.  Stage IIb diffuse large B-cell lymphoma: -She does not have any B symptoms.  No palpable adenopathy. -Reviewed labs from 07/22/2020.  LDH is normal.  LFTs and CBC were also normal. -RTC 1 year with follow-up labs.  Port flush every 3 months.  2.  CKD: -Creatinine today is 1.91.  Encouraged adequate fluid intake.    Orders placed this encounter:  Orders Placed This Encounter  Procedures   CBC with Differential/Platelet   Comprehensive metabolic panel   Lactate dehydrogenase   Schedule Portacath Flush Appointment     Derek Jack, MD [place] [phone]   I, Milinda Antis, am acting as a scribe for Dr. Sanda Linger.  I, Derek Jack MD, have reviewed the above documentation for accuracy and completeness, and I agree with the above.

## 2020-07-22 NOTE — Progress Notes (Signed)
Port a cath flushed per MD orders. See MAR and flowsheet for details. Patient tolerated without incident. Discharged via wheelchair with family member in stable condition.

## 2020-07-22 NOTE — Patient Instructions (Signed)
[place] at [facility] Discharge Instructions  You were seen today by Dr. Delton Coombes. He went over your recent results. Drink plenty of fluids daily to keep your kidneys flushed. Keep getting your port flushed every 3 months. Dr. Delton Coombes will see you back in 1 year for labs and follow up.   Thank you for choosing [place] at [facility] to provide your oncology and hematology care.  To afford each patient quality time with our provider, please arrive at least 15 minutes before your scheduled appointment time.   If you have a lab appointment with the [employer] [facility] please come in thru the Main Entrance and check in at the main information desk  You need to re-schedule your appointment should you arrive 10 or more minutes late.  We strive to give you quality time with our providers, and arriving late affects you and other patients whose appointments are after yours.  Also, if you no show three or more times for appointments you may be dismissed from the clinic at the providers discretion.     Again, thank you for choosing [facility].  Our hope is that these requests will decrease the amount of time that you wait before being seen by our physicians.       _____________________________________________________________  Should you have questions after your visit to [facility], please contact our office at [phone] between the hours of 8:00 a.m. and 4:30 p.m.  Voicemails left after 4:00 p.m. will not be returned until the following business day.  For prescription refill requests, have your pharmacy contact our office and allow 72 hours.    Cancer Center Support Programs:   > Cancer Support Group  2nd Tuesday of the month 1pm-2pm, Journey Room

## 2020-10-27 ENCOUNTER — Inpatient Hospital Stay (HOSPITAL_COMMUNITY): Payer: Medicare HMO | Attending: Hematology

## 2020-10-27 ENCOUNTER — Emergency Department (HOSPITAL_COMMUNITY)
Admission: EM | Admit: 2020-10-27 | Discharge: 2020-10-27 | Disposition: A | Discharge status: Home / Self Care | Payer: Medicare HMO | Attending: Emergency Medicine | Admitting: Emergency Medicine

## 2020-10-27 ENCOUNTER — Encounter (HOSPITAL_COMMUNITY): Payer: Self-pay | Admitting: *Deleted

## 2020-10-27 ENCOUNTER — Other Ambulatory Visit: Payer: Self-pay

## 2020-10-27 DIAGNOSIS — Z91148 Patient's other noncompliance with medication regimen for other reason: Secondary | ICD-10-CM

## 2020-10-27 DIAGNOSIS — E039 Hypothyroidism, unspecified: Secondary | ICD-10-CM | POA: Insufficient documentation

## 2020-10-27 DIAGNOSIS — Z9119 Patient's noncompliance with other medical treatment and regimen: Secondary | ICD-10-CM | POA: Diagnosis not present

## 2020-10-27 DIAGNOSIS — Z7982 Long term (current) use of aspirin: Secondary | ICD-10-CM | POA: Insufficient documentation

## 2020-10-27 DIAGNOSIS — Z9114 Patient's other noncompliance with medication regimen: Secondary | ICD-10-CM

## 2020-10-27 DIAGNOSIS — I10 Essential (primary) hypertension: Secondary | ICD-10-CM

## 2020-10-27 LAB — I-STAT CHEM 8, ED
BUN: 31 mg/dL — ABNORMAL HIGH (ref 8–23)
Calcium, Ion: 1.17 mmol/L (ref 1.15–1.40)
Chloride: 100 mmol/L (ref 98–111)
Creatinine, Ser: 1.7 mg/dL — ABNORMAL HIGH (ref 0.44–1.00)
Glucose, Bld: 118 mg/dL — ABNORMAL HIGH (ref 70–99)
HCT: 43 % (ref 36.0–46.0)
Hemoglobin: 14.6 g/dL (ref 12.0–15.0)
Potassium: 3.4 mmol/L — ABNORMAL LOW (ref 3.5–5.1)
Sodium: 140 mmol/L (ref 135–145)
TCO2: 28 mmol/L (ref 22–32)

## 2020-10-27 MED ORDER — METOPROLOL TARTRATE 25 MG PO TABS
25.0000 mg | ORAL_TABLET | Freq: Once | ORAL | Status: AC
  Administered 2020-10-27: 25 mg via ORAL
  Filled 2020-10-27: qty 1

## 2020-10-27 MED ORDER — AMLODIPINE BESYLATE 5 MG PO TABS
5.0000 mg | ORAL_TABLET | Freq: Once | ORAL | Status: AC
  Administered 2020-10-27: 5 mg via ORAL
  Filled 2020-10-27: qty 1

## 2020-10-27 NOTE — ED Provider Notes (Signed)
[facility] EMERGENCY DEPARTMENT Provider Note   CSN: [id number] Arrival date & time: 10/27/20  1200     History Chief Complaint  Patient presents with  . Hypertension    Cristina Gregory is a 86 y.o. female.  HPI      Cristina Gregory is a 86 y.o. female with past medical history of hypertension, chronic renal insufficiency, B-cell lymphoma (currently in remission ) and hypothyroidism .  She presents to the Emergency Department sent here from the cancer center for evaluation of elevated blood pressure.  She went to the cancer center earlier today to have her port flushed.  She does not currently receive chemotherapy or radiation.  She was noted to have elevated blood pressure and sent here for further evaluation.  Patient denies headache, neck pain, dizziness, nausea or vomiting, or visual changes.  She does admit that she has not taken her blood pressure medications for approximately 1 week.  Patient's son at bedside has also confirmed this.  Patient unable to provide reason she stopped taking her medications, but her son believes that she may have some dementia and doesn't remember to take them.     Past Medical History:  Diagnosis Date  . Chronic renal insufficiency   . DLBCL (diffuse large B cell lymphoma) (HCC) 07/13/2016  . Gout   . Hypertension   . Hypothyroidism     Patient Active Problem List   Diagnosis Date Noted  . Abnormal PET scan of colon 02/07/2017  . DLBCL (diffuse large B cell lymphoma) (HCC) 07/13/2016    Past Surgical History:  Procedure Laterality Date  . COLONOSCOPY N/A 03/06/2017   Procedure: COLONOSCOPY;  Surgeon: West BaliFields, Sandi L, MD;  Location: [facility] ENDO SUITE;  Service: Endoscopy;  Laterality: N/A;  10:00am  . Port-A-Cath placement    . SLT LASER APPLICATION Right 05/12/2014   Procedure: SLT LASER APPLICATION;  Surgeon: Susa Simmondsarroll F Haines, MD;  Location: [facility] ORS;  Service: Ophthalmology;  Laterality: Right;     OB History   No obstetric history  on file.     Family History  Problem Relation Age of Onset  . Colon cancer Mother        Patient states her mother has been: Issues before it could be evaluated she died from heart disease.    Social History   Tobacco Use  . Smoking status: Never Smoker  . Smokeless tobacco: Never Used  Vaping Use  . Vaping Use: Never used  Substance Use Topics  . Alcohol use: No  . Drug use: No    Home Medications Prior to Admission medications   Medication Sig Start Date End Date Taking? Authorizing Provider  acetaminophen (TYLENOL) 325 MG tablet Take 650 mg by mouth every 6 (six) hours as needed.   Yes [provider]  allopurinol (ZYLOPRIM) 300 MG tablet Take 300 mg by mouth daily at 12 noon.    Yes [provider]  amLODipine (NORVASC) 5 MG tablet Take 5 mg by mouth daily at 12 noon.    Yes [provider]  glipiZIDE (GLUCOTROL) 5 MG tablet Take 1 mg by mouth daily.   Yes [provider]  hydrochlorothiazide (HYDRODIURIL) 12.5 MG tablet Take 12.5 mg by mouth daily at 12 noon. 1/2 pill once daily   Yes [provider]  levothyroxine (SYNTHROID, LEVOTHROID) 88 MCG tablet Take 88 mcg by mouth daily before breakfast.   Yes [provider]  metoprolol tartrate (LOPRESSOR) 25 MG tablet Take 25 mg by  mouth 2 (two) times daily.   Yes [provider]  polyethylene glycol (MIRALAX / GLYCOLAX) packet Take 17 g by mouth daily as needed.    Yes [provider]  simvastatin (ZOCOR) 20 MG tablet Take 20 mg by mouth at bedtime.    Yes [provider]  aspirin 81 MG EC tablet Take 81 mg by mouth daily at 12 noon.  Patient not taking: Reported on 10/27/2020    [provider]  clopidogrel (PLAVIX) 75 MG tablet Take 75 mg by mouth daily at 12 noon.     [provider]    Allergies    Patient has no known allergies.  Review of Systems   Review of Systems  Constitutional: Negative for chills, fatigue and  fever.  HENT: Negative for sore throat and trouble swallowing.   Respiratory: Negative for cough, shortness of breath and wheezing.   Cardiovascular: Negative for chest pain and palpitations.  Gastrointestinal: Negative for abdominal pain, blood in stool, nausea and vomiting.  Genitourinary: Negative for dysuria, flank pain and hematuria.  Musculoskeletal: Negative for arthralgias, back pain, myalgias, neck pain and neck stiffness.  Skin: Negative for rash.  Neurological: Negative for dizziness, weakness and numbness.  Hematological: Does not bruise/bleed easily.    Physical Exam Updated Vital Signs BP (!) 206/104   Pulse 78   Temp 98.5 F (36.9 C) (Oral)   Resp 14   SpO2 97%   Physical Exam Constitutional:      General: She is not in acute distress.    Appearance: Normal appearance. She is not ill-appearing.  HENT:     Head: Normocephalic.     Mouth/Throat:     Mouth: Mucous membranes are moist.  Eyes:     Pupils: Pupils are equal, round, and reactive to light.  Neck:     Thyroid: No thyromegaly.     Meningeal: Kernig's sign absent.  Cardiovascular:     Rate and Rhythm: Normal rate and regular rhythm.     Pulses: Normal pulses.  Pulmonary:     Effort: Pulmonary effort is normal.     Breath sounds: Normal breath sounds. No wheezing.  Abdominal:     Palpations: Abdomen is soft.     Tenderness: There is no abdominal tenderness. There is no guarding or rebound.  Musculoskeletal:        General: Normal range of motion.     Cervical back: Normal range of motion. No tenderness.     Right lower leg: No edema.     Left lower leg: No edema.  Skin:    General: Skin is warm.     Capillary Refill: Capillary refill takes less than 2 seconds.     Findings: No rash.  Neurological:     General: No focal deficit present.     Mental Status: She is alert and oriented to person, place, and time.     Sensory: Sensation is intact. No sensory deficit.     Motor: Motor function is  intact. No weakness.     Coordination: Coordination is intact. Coordination normal.     Comments: CN II-XII intact.  Speech clear.  No facial droop.  Grips strong and symmetrical.  No pronator drift.       ED Results / Procedures / Treatments   Labs (all labs ordered are listed, but only abnormal results are displayed) Labs Reviewed  I-STAT CHEM 8, ED - Abnormal; Notable for the following components:      Result Value  Potassium 3.4 (*)    BUN 31 (*)    Creatinine, Ser 1.70 (*)    Glucose, Bld 118 (*)    All other components within normal limits    EKG None  Radiology No results found.  Procedures Procedures (including critical care time)  Medications Ordered in ED Medications  amLODipine (NORVASC) tablet 5 mg (5 mg Oral Given 10/27/20 1843)  metoprolol tartrate (LOPRESSOR) tablet 25 mg (25 mg Oral Given 10/27/20 1843)    ED Course  I have reviewed the triage vital signs and the nursing notes.  Pertinent labs & imaging results that were available during my care of the patient were reviewed by me and considered in my medical decision making (see chart for details).    MDM Rules/Calculators/A&P                          Patient sent here from cancer center for evaluation of hypertension.  Patient denies symptoms upon arrival.  Patient son at bedside confirms that patient has been noncompliant with taking her medications.  Reason is unclear.  Son reports some possible dementia and feels that she may be forgetting to take her medications.  On exam, no focal neuro deficits.  She is well-appearing.  Hypertensive with systolic in the 200s.  I will order her daily medication and i-STAT to check electrolytes and blood sugar    2020  Pt resting comfortably.  BP now 186/96.  I have discussed the importance to taking her antihypertensive medications daily.  I have discussed this with patient and with her son.  Son agrees to closely monitor her medications and ensure that she is taking  them correctly.  I-STAT drawn to assess for electrolyte abnormalities and blood glucose level.  Results interpreted by me.  Kidney functions are elevated, but appear baseline.  She does have chronic renal insufficiency.  She is feeling better, denies symptoms at this time.  I feel that she is appropriate for discharge home.  Patient and son both agreed to close follow-up with PCP this week.  Strict return precautions were discussed.  Final Clinical Impression(s) / ED Diagnoses Final diagnoses:  Hypertension, unspecified type  Non compliance w medication regimen    Rx / DC Orders ED Discharge Orders    None       Rosey Bath 10/27/20 2049    Terrilee Files, MD 10/28/20 1043

## 2020-10-27 NOTE — Progress Notes (Signed)
Patient's blood pressure at her OV today was 223/171.  Advised to the ED by Dr. Ellin Saba.

## 2020-10-27 NOTE — Discharge Instructions (Addendum)
Is important that she takes her blood pressure medication every day. Start her routine medications in the morning. Call her primary care provider for recheck. Return to emergency department for any worsening symptoms.

## 2020-10-27 NOTE — Progress Notes (Signed)
Patients port flushed without difficulty.  Good blood return noted with no bruising or swelling noted at site.  Band aid applied.  VSS with discharge and left in satisfactory condition with no s/s of distress noted.   

## 2020-10-27 NOTE — ED Triage Notes (Signed)
Sent from the cancer center for evaluation of blood pressure

## 2021-01-25 ENCOUNTER — Inpatient Hospital Stay (HOSPITAL_COMMUNITY): Payer: Medicare HMO | Attending: Hematology

## 2021-01-25 ENCOUNTER — Other Ambulatory Visit: Payer: Self-pay

## 2021-01-25 ENCOUNTER — Encounter (HOSPITAL_COMMUNITY): Payer: Self-pay

## 2021-01-25 VITALS — BP 147/74 | HR 61 | Temp 97.0°F | Resp 16

## 2021-01-25 DIAGNOSIS — R531 Weakness: Secondary | ICD-10-CM | POA: Insufficient documentation

## 2021-01-25 DIAGNOSIS — Z9221 Personal history of antineoplastic chemotherapy: Secondary | ICD-10-CM | POA: Insufficient documentation

## 2021-01-25 DIAGNOSIS — Z452 Encounter for adjustment and management of vascular access device: Secondary | ICD-10-CM | POA: Insufficient documentation

## 2021-01-25 DIAGNOSIS — I129 Hypertensive chronic kidney disease with stage 1 through stage 4 chronic kidney disease, or unspecified chronic kidney disease: Secondary | ICD-10-CM | POA: Insufficient documentation

## 2021-01-25 DIAGNOSIS — Z923 Personal history of irradiation: Secondary | ICD-10-CM | POA: Insufficient documentation

## 2021-01-25 DIAGNOSIS — C8331 Diffuse large B-cell lymphoma, lymph nodes of head, face, and neck: Secondary | ICD-10-CM | POA: Insufficient documentation

## 2021-01-25 DIAGNOSIS — H02402 Unspecified ptosis of left eyelid: Secondary | ICD-10-CM | POA: Insufficient documentation

## 2021-01-25 DIAGNOSIS — N189 Chronic kidney disease, unspecified: Secondary | ICD-10-CM | POA: Diagnosis not present

## 2021-01-25 DIAGNOSIS — C8330 Diffuse large B-cell lymphoma, unspecified site: Secondary | ICD-10-CM

## 2021-01-25 MED ORDER — HEPARIN SOD (PORK) LOCK FLUSH 100 UNIT/ML IV SOLN
500.0000 [IU] | Freq: Once | INTRAVENOUS | Status: AC
  Administered 2021-01-25: 500 [IU] via INTRAVENOUS

## 2021-01-25 MED ORDER — SODIUM CHLORIDE 0.9% FLUSH
10.0000 mL | Freq: Once | INTRAVENOUS | Status: AC
  Administered 2021-01-25: 10 mL via INTRAVENOUS

## 2021-01-25 NOTE — Progress Notes (Signed)
Port flushed with good blood return noted. No bruising or swelling at site. Bandaid applied and patient discharged in satisfactory condition. VVS stable with no signs or symptoms of distressed noted. 

## 2021-01-25 NOTE — Patient Instructions (Signed)
[place] at [facility]  Discharge Instructions:  Your port was flushed today return as directed. _______________________________________________________________  Thank you for choosing [place] at [facility] to provide your oncology and hematology care.  To afford each patient quality time with our providers, please arrive at least 15 minutes before your scheduled appointment.  You need to re-schedule your appointment if you arrive 10 or more minutes late.  We strive to give you quality time with our providers, and arriving late affects you and other patients whose appointments are after yours.  Also, if you no show three or more times for appointments you may be dismissed from the clinic.  Again, thank you for choosing [place] at [place] hope is that these requests will allow you access to exceptional care and in a timely manner. _______________________________________________________________  If you have questions after your visit, please contact our office at [phone] between the hours of 8:30 a.m. and 5:00 p.m. Voicemails left after 4:30 p.m. will not be returned until the following business day. _______________________________________________________________  For prescription refill requests, have your pharmacy contact our office. _______________________________________________________________  Recommendations made by the consultant and any test results will be sent to your referring physician. _______________________________________________________________

## 2021-05-03 ENCOUNTER — Inpatient Hospital Stay (HOSPITAL_COMMUNITY): Payer: Medicare HMO | Attending: Hematology

## 2021-05-03 ENCOUNTER — Other Ambulatory Visit: Payer: Self-pay

## 2021-05-03 DIAGNOSIS — C8331 Diffuse large B-cell lymphoma, lymph nodes of head, face, and neck: Secondary | ICD-10-CM | POA: Insufficient documentation

## 2021-05-03 DIAGNOSIS — Z452 Encounter for adjustment and management of vascular access device: Secondary | ICD-10-CM | POA: Diagnosis not present

## 2021-05-03 MED ORDER — SODIUM CHLORIDE 0.9% FLUSH
10.0000 mL | Freq: Once | INTRAVENOUS | Status: AC
  Administered 2021-05-03: 10 mL via INTRAVENOUS

## 2021-05-03 MED ORDER — HEPARIN SOD (PORK) LOCK FLUSH 100 UNIT/ML IV SOLN
500.0000 [IU] | Freq: Once | INTRAVENOUS | Status: AC
  Administered 2021-05-03: 500 [IU] via INTRAVENOUS

## 2021-05-03 NOTE — Progress Notes (Signed)
Patients port flushed without difficulty.  Good blood return noted with no bruising or swelling noted at site.  Band aid applied.  VSS with discharge and left in satisfactory condition with no s/s of distress noted.   Discharge from clinic via wheelchair in stable condition.  Alert and oriented X 3.  Follow up with [facility] as scheduled.  

## 2021-05-03 NOTE — Patient Instructions (Signed)
[facility]  Discharge Instructions: Thank you for choosing [facility] to provide your oncology and hematology care.  If you have a lab appointment with the Cancer Center, please come in thru the Main Entrance and check in at the main information desk.  Wear comfortable clothing and clothing appropriate for easy access to any Portacath or PICC line.   We strive to give you quality time with your provider. You may need to reschedule your appointment if you arrive late (15 or more minutes).  Arriving late affects you and other patients whose appointments are after yours.  Also, if you miss three or more appointments without notifying the office, you may be dismissed from the clinic at the provider's discretion.      For prescription refill requests, have your pharmacy contact our office and allow 72 hours for refills to be completed.    Today you received the following chemotherapy and/or immunotherapy agents    To help prevent nausea and vomiting after your treatment, we encourage you to take your nausea medication as directed.  BELOW ARE SYMPTOMS THAT SHOULD BE REPORTED IMMEDIATELY: . *FEVER GREATER THAN 100.4 F (38 C) OR HIGHER . *CHILLS OR SWEATING . *NAUSEA AND VOMITING THAT IS NOT CONTROLLED WITH YOUR NAUSEA MEDICATION . *UNUSUAL SHORTNESS OF BREATH . *UNUSUAL BRUISING OR BLEEDING . *URINARY PROBLEMS (pain or burning when urinating, or frequent urination) . *BOWEL PROBLEMS (unusual diarrhea, constipation, pain near the anus) . TENDERNESS IN MOUTH AND THROAT WITH OR WITHOUT PRESENCE OF ULCERS (sore throat, sores in mouth, or a toothache) . UNUSUAL RASH, SWELLING OR PAIN  . UNUSUAL VAGINAL DISCHARGE OR ITCHING   Items with * indicate a potential emergency and should be followed up as soon as possible or go to the Emergency Department if any problems should occur.  Please show the CHEMOTHERAPY ALERT CARD or IMMUNOTHERAPY ALERT CARD at check-in to the  Emergency Department and triage nurse.  Should you have questions after your visit or need to cancel or reschedule your appointment, please contact [facility] [phone]  and follow the prompts.  Office hours are 8:00 a.m. to 4:30 p.m. Monday - Friday. Please note that voicemails left after 4:00 p.m. may not be returned until the following business day.  We are closed weekends and major holidays. You have access to a nurse at all times for urgent questions. Please call the main number to the clinic [phone] and follow the prompts.  For any non-urgent questions, you may also contact your provider using MyChart. We now offer e-Visits for anyone 18 and older to request care online for non-urgent symptoms. For details visit [email or web address].   Also download the MyChart app! Go to the app store, search "MyChart", open the app, select [place], and log in with your MyChart username and password.  Due to Covid, a mask is required upon entering the hospital/clinic. If you do not have a mask, one will be given to you upon arrival. For doctor visits, patients may have 1 support person aged 18 or older with them. For treatment visits, patients cannot have anyone with them due to current Covid guidelines and our immunocompromised population.  

## 2021-08-02 NOTE — Progress Notes (Signed)
[place], [place] [address]   CLINIC:  Medical Oncology/Hematology  PCP:  Manon Hilding, MD [address] [place] [address] [phone]   REASON FOR VISIT:  Follow-up for DLBCL  PRIOR THERAPY: R-CHOP x 4 cycles through 07/2015  NGS Results: not done  CURRENT THERAPY: surveillance  BRIEF ONCOLOGIC HISTORY:  Oncology History  DLBCL (diffuse large B cell lymphoma) ([facility])  05/06/2015 Initial Diagnosis   DLBCL (diffuse large B cell lymphoma) ([facility]).  Diagnosis via right supraclavicular/cervical lymph node biopsy.   05/26/2015 Cancer Staging   Stage IIB   05/26/2015 PET scan   Hypermetabolic right supraclavicular adenopathy. Mild hypermetabolic AP window lymph node. A focal hypermetabolic wall thickening in the mid sigmoid colon, correlate for acute diverticulitis versus mass. No hypermetabolic right lung base nodule, likely too small for evaluation by PET   05/28/2015 Echocardiogram   Left ventricle is normal in size. Mild concentric left ventricular hypertrophy. Left ventricle ejection fraction is normal (60-65 percent). Left ventricular diastolic function is abnormal. There is mild aortic regurgitation.   06/04/2015 -  Chemotherapy   RCHOP, administered by Dr. Tressie Stalker at [facility].  Dose reduction beginning on cycle two.  Due to complications, received 4 cycles of chemotherapy. Her last 2 cycles cancelled.   07/06/2015 Adverse Reaction   Hospitalized with neutropenic fever but negative blood cultures   08/03/2015 Remission   PET scan shows complete remission after 3 cycles of chemotherapy   08/03/2015 PET scan   A near complete resolution of right supraclavicular nodes. No hypermetabolic activity remaining. Stable activity within the left thyroid lobe. Ultrasound for further evaluation if clinically indicated. Stable 3 mm right basilar nodule. Nonspecific activity in the sigmoid colon is increased.   08/20/2015 Adverse Reaction   Hospitalized  after cycle 4 with neutropenic fever, thrombocytopenia, and Staphylococcus epidermidis blood culture positive 1   09/11/2015 - 10/30/2015 Radiation Therapy   36 Gy delivered to areas of previous involvement due to inability to tolerate further chemotherapy.     CANCER STAGING: Cancer Staging DLBCL (diffuse large B cell lymphoma) ([facility]) Staging form: Lymphoid Neoplasms, AJCC 6th Edition - Clinical stage from 05/26/2015: Stage II - Signed by Baird Cancer, PA-C on 07/13/2016   INTERVAL HISTORY:  Ms. NATALIE MCEUEN, a 86 y.o. female, returns for routine follow-up of her DLBCL. Shermeka was last seen on 07/22/2021.   Today she reports feeling well, and she is accompanied by a friend. She denies fevers and night sweats, but she reports unintentional weight loss of 23 pounds over the past year. Her appetite is poor. She is currently living with her son and daughter-in-law.   REVIEW OF SYSTEMS:  Review of Systems  Constitutional:  Positive for appetite change (25%) and unexpected weight change (-23 lbs). Negative for fatigue (50%) and fever.  Gastrointestinal:  Positive for constipation.  Musculoskeletal:  Positive for arthralgias (7/10 hip).  Neurological:  Positive for dizziness.  Psychiatric/Behavioral:  Positive for sleep disturbance.   All other systems reviewed and are negative.  PAST MEDICAL/SURGICAL HISTORY:  Past Medical History:  Diagnosis Date   Chronic renal insufficiency    DLBCL (diffuse large B cell lymphoma) ([facility]) 07/13/2016   Gout    Hypertension    Hypothyroidism    Past Surgical History:  Procedure Laterality Date   COLONOSCOPY N/A 03/06/2017   Procedure: COLONOSCOPY;  Surgeon: Danie Binder, MD;  Location: [facility];  Service: Endoscopy;  Laterality: N/A;  10:00am  Port-A-Cath placement     SLT LASER APPLICATION Right 0/56/9794   Procedure: SLT LASER APPLICATION;  Surgeon: Williams Che, MD;  Location: [facility] ORS;  Service: Ophthalmology;  Laterality:  Right;    SOCIAL HISTORY:  Social History   Socioeconomic History   Marital status: Single    Spouse name: Not on file   Number of children: 4   Years of education: Not on file   Highest education level: Not on file  Occupational History   Not on file  Tobacco Use   Smoking status: Never   Smokeless tobacco: Never  Vaping Use   Vaping Use: Never used  Substance and Sexual Activity   Alcohol use: No   Drug use: No   Sexual activity: Not on file  Other Topics Concern   Not on file  Social History Narrative   Not on file   Social Determinants of Health   Financial Resource Strain: Not on file  Food Insecurity: Not on file  Transportation Needs: Not on file  Physical Activity: Not on file  Stress: Not on file  Social Connections: Not on file  Intimate Partner Violence: Not on file    FAMILY HISTORY:  Family History  Problem Relation Age of Onset   Colon cancer Mother        Patient states her mother has been: Issues before it could be evaluated she died from heart disease.    CURRENT MEDICATIONS:  Current Outpatient Medications  Medication Sig Dispense Refill   acetaminophen (TYLENOL) 325 MG tablet Take 650 mg by mouth every 6 (six) hours as needed.     allopurinol (ZYLOPRIM) 300 MG tablet Take 300 mg by mouth daily at 12 noon.      amLODipine (NORVASC) 5 MG tablet Take 5 mg by mouth daily at 12 noon.      aspirin 81 MG EC tablet Take 81 mg by mouth daily at 12 noon.     clopidogrel (PLAVIX) 75 MG tablet Take 75 mg by mouth daily at 12 noon.      glipiZIDE (GLUCOTROL) 5 MG tablet Take 1 mg by mouth daily.     hydrochlorothiazide (HYDRODIURIL) 12.5 MG tablet Take 12.5 mg by mouth daily at 12 noon. 1/2 pill once daily     levothyroxine (SYNTHROID, LEVOTHROID) 88 MCG tablet Take 88 mcg by mouth daily before breakfast.     metoprolol tartrate (LOPRESSOR) 25 MG tablet Take 25 mg by mouth 2 (two) times daily.     polyethylene glycol (MIRALAX / GLYCOLAX) packet Take 17  g by mouth daily as needed.      simvastatin (ZOCOR) 20 MG tablet Take 20 mg by mouth at bedtime.      No current facility-administered medications for this visit.    ALLERGIES:  No Known Allergies  PHYSICAL EXAM:  Performance status (ECOG): 2 - Symptomatic, <50% confined to bed  There were no vitals filed for this visit. Wt Readings from Last 3 Encounters:  07/22/20 123 lb 12.8 oz (56.2 kg)  01/20/20 123 lb (55.8 kg)  07/18/19 135 lb 12.8 oz (61.6 kg)   Physical Exam Vitals reviewed.  Constitutional:      Appearance: Normal appearance.     Comments: In wheelchair  Cardiovascular:     Rate and Rhythm: Normal rate and regular rhythm.     Pulses: Normal pulses.     Heart sounds: Normal heart sounds.  Pulmonary:     Effort: Pulmonary effort is normal.  Breath sounds: Normal breath sounds.  Abdominal:     Palpations: Abdomen is soft. There is no hepatomegaly, splenomegaly or mass.     Tenderness: There is no abdominal tenderness.  Lymphadenopathy:     Cervical: No cervical adenopathy.     Right cervical: No superficial, deep or posterior cervical adenopathy.    Left cervical: No superficial, deep or posterior cervical adenopathy.     Upper Body:     Right upper body: No supraclavicular adenopathy.     Left upper body: No supraclavicular adenopathy.  Neurological:     General: No focal deficit present.     Mental Status: She is alert and oriented to person, place, and time.  Psychiatric:        Mood and Affect: Mood normal.        Behavior: Behavior normal.     LABORATORY DATA:  I have reviewed the labs as listed.  CBC Latest Ref Rng & Units 10/27/2020 07/22/2020 01/13/2020  WBC 4.0 - 10.5 K/uL - 7.7 9.1  Hemoglobin 12.0 - 15.0 g/dL 14.6 12.0 13.5  Hematocrit 36.0 - 46.0 % 43.0 36.6 41.3  Platelets 150 - 400 K/uL - 195 230   CMP Latest Ref Rng & Units 10/27/2020 07/22/2020 01/13/2020  Glucose 70 - 99 mg/dL 118(H) 294(H) 181(H)  BUN 8 - 23 mg/dL 31(H) 58(H) 58(H)   Creatinine 0.44 - 1.00 mg/dL 1.70(H) 1.91(H) 1.72(H)  Sodium 135 - 145 mmol/L 140 137 137  Potassium 3.5 - 5.1 mmol/L 3.4(L) 3.3(L) 3.4(L)  Chloride 98 - 111 mmol/L 100 100 98  CO2 22 - 32 mmol/L - 25 27  Calcium 8.9 - 10.3 mg/dL - 9.3 9.8  Total Protein 6.5 - 8.1 g/dL - 7.3 7.9  Total Bilirubin 0.3 - 1.2 mg/dL - 0.7 0.5  Alkaline Phos 38 - 126 U/L - 59 85  AST 15 - 41 U/L - 20 29  ALT 0 - 44 U/L - 17 42    DIAGNOSTIC IMAGING:  I have independently reviewed the scans and discussed with the patient. No results found.   ASSESSMENT:  1.  Stage IIb diffuse large B-cell lymphoma: -4 cycles of R-CHOP completed on October 2016, truncated secondary to recurrent infections. -XRT by Dr. Isidore Moos from 09/11/2015 through 10/30/2015.   2.  CKD: -Creatinine between 1.7-2.0.   3.  Left eye ptosis: -Dr. Quintin Alto did a chest x-ray which was reportedly negative. -MRI of the brain on 08/14/2019 shows atrophy and chronic ischemic changes moderate. -Her left eye ptosis has improved since then.   PLAN:  1.  Stage IIb diffuse large B-cell lymphoma: - She does not have any fevers or night sweats. - However she lost about 23 pounds in the last 1 year.  She reports decreased appetite. - Physical examination did not reveal any palpable adenopathy in the neck or axilla.  No palpable organomegaly.  LDH was normal. - Due to the weight loss, I have recommended CT CAP with oral contrast. - We will schedule for phone visit to discuss results with her son. - If the CT does not show any evidence of recurrence, will discontinue Port-A-Cath.   2.  CKD: - Creatinine has increased to 2.51. - She had HCTZ 25 mg daily started around 06/09/2021 which could be contributing to increased creatinine. - She will check with Dr. Quintin Alto about alternatives.   Orders placed this encounter:  No orders of the defined types were placed in this encounter.    Derek Jack, MD [facility]  [place] [phone]   I, Thana Ates, am acting as a scribe for Dr. Derek Jack.  I, Derek Jack MD, have reviewed the above documentation for accuracy and completeness, and I agree with the above.

## 2021-08-03 ENCOUNTER — Inpatient Hospital Stay (HOSPITAL_BASED_OUTPATIENT_CLINIC_OR_DEPARTMENT_OTHER): Payer: Medicare HMO | Admitting: Hematology

## 2021-08-03 ENCOUNTER — Inpatient Hospital Stay (HOSPITAL_COMMUNITY): Payer: Medicare HMO | Attending: Hematology

## 2021-08-03 ENCOUNTER — Other Ambulatory Visit: Payer: Self-pay

## 2021-08-03 VITALS — BP 147/59 | HR 58 | Temp 97.4°F | Resp 18 | Wt 100.3 lb

## 2021-08-03 DIAGNOSIS — C8330 Diffuse large B-cell lymphoma, unspecified site: Secondary | ICD-10-CM

## 2021-08-03 DIAGNOSIS — Z452 Encounter for adjustment and management of vascular access device: Secondary | ICD-10-CM | POA: Insufficient documentation

## 2021-08-03 DIAGNOSIS — N189 Chronic kidney disease, unspecified: Secondary | ICD-10-CM | POA: Insufficient documentation

## 2021-08-03 DIAGNOSIS — Z23 Encounter for immunization: Secondary | ICD-10-CM | POA: Diagnosis not present

## 2021-08-03 DIAGNOSIS — Z8572 Personal history of non-Hodgkin lymphomas: Secondary | ICD-10-CM | POA: Insufficient documentation

## 2021-08-03 LAB — CBC WITH DIFFERENTIAL/PLATELET
Abs Immature Granulocytes: 0.03 10*3/uL (ref 0.00–0.07)
Basophils Absolute: 0.1 10*3/uL (ref 0.0–0.1)
Basophils Relative: 1 %
Eosinophils Absolute: 0.2 10*3/uL (ref 0.0–0.5)
Eosinophils Relative: 2 %
HCT: 36.1 % (ref 36.0–46.0)
Hemoglobin: 11.6 g/dL — ABNORMAL LOW (ref 12.0–15.0)
Immature Granulocytes: 0 %
Lymphocytes Relative: 18 %
Lymphs Abs: 1.7 10*3/uL (ref 0.7–4.0)
MCH: 29.5 pg (ref 26.0–34.0)
MCHC: 32.1 g/dL (ref 30.0–36.0)
MCV: 91.9 fL (ref 80.0–100.0)
Monocytes Absolute: 0.7 10*3/uL (ref 0.1–1.0)
Monocytes Relative: 8 %
Neutro Abs: 6.7 10*3/uL (ref 1.7–7.7)
Neutrophils Relative %: 71 %
Platelets: 236 10*3/uL (ref 150–400)
RBC: 3.93 MIL/uL (ref 3.87–5.11)
RDW: 14.9 % (ref 11.5–15.5)
WBC: 9.5 10*3/uL (ref 4.0–10.5)
nRBC: 0 % (ref 0.0–0.2)

## 2021-08-03 LAB — COMPREHENSIVE METABOLIC PANEL
ALT: 30 U/L (ref 0–44)
AST: 32 U/L (ref 15–41)
Albumin: 4.1 g/dL (ref 3.5–5.0)
Alkaline Phosphatase: 75 U/L (ref 38–126)
Anion gap: 12 (ref 5–15)
BUN: 64 mg/dL — ABNORMAL HIGH (ref 8–23)
CO2: 26 mmol/L (ref 22–32)
Calcium: 9.3 mg/dL (ref 8.9–10.3)
Chloride: 99 mmol/L (ref 98–111)
Creatinine, Ser: 2.51 mg/dL — ABNORMAL HIGH (ref 0.44–1.00)
GFR, Estimated: 18 mL/min — ABNORMAL LOW (ref >60)
Glucose, Bld: 210 mg/dL — ABNORMAL HIGH (ref 70–99)
Potassium: 3.6 mmol/L (ref 3.5–5.1)
Sodium: 137 mmol/L (ref 135–145)
Total Bilirubin: 0.6 mg/dL (ref 0.3–1.2)
Total Protein: 7.5 g/dL (ref 6.5–8.1)

## 2021-08-03 LAB — LACTATE DEHYDROGENASE: LDH: 114 U/L (ref 98–192)

## 2021-08-03 MED ORDER — HEPARIN SOD (PORK) LOCK FLUSH 100 UNIT/ML IV SOLN
500.0000 [IU] | Freq: Once | INTRAVENOUS | Status: AC
  Administered 2021-08-03: 500 [IU] via INTRAVENOUS

## 2021-08-03 MED ORDER — INFLUENZA VAC A&B SA ADJ QUAD 0.5 ML IM PRSY
0.5000 mL | PREFILLED_SYRINGE | Freq: Once | INTRAMUSCULAR | Status: AC
  Administered 2021-08-03: 0.5 mL via INTRAMUSCULAR
  Filled 2021-08-03: qty 0.5

## 2021-08-03 MED ORDER — SODIUM CHLORIDE 0.9% FLUSH
10.0000 mL | Freq: Once | INTRAVENOUS | Status: AC
  Administered 2021-08-03: 10 mL via INTRAVENOUS

## 2021-08-03 NOTE — Progress Notes (Signed)
Patients port flushed without difficulty.  Good blood return noted with no bruising or swelling noted at site.  Band aid applied.  VSS with discharge and left in satisfactory condition with no s/s of distress noted.   

## 2021-08-03 NOTE — Progress Notes (Signed)
Patient come in for office visit. Flu vaccine ordered, See MAR for administration information. Patient remained stable throughout injection. Patient discharged with daughter via wheel chair in stable condition.

## 2021-08-03 NOTE — Patient Instructions (Signed)
[facility]  Discharge Instructions: °Thank you for choosing [facility] to provide your oncology and hematology care.  °If you have a lab appointment with the Cancer Center, please come in thru the Main Entrance and check in at the main information desk. ° °Wear comfortable clothing and clothing appropriate for easy access to any Portacath or PICC line.  ° °We strive to give you quality time with your provider. You may need to reschedule your appointment if you arrive late (15 or more minutes).  Arriving late affects you and other patients whose appointments are after yours.  Also, if you miss three or more appointments without notifying the office, you may be dismissed from the clinic at the provider’s discretion.    °  °For prescription refill requests, have your pharmacy contact our office and allow 72 hours for refills to be completed.   ° °Today you received the following chemotherapy and/or immunotherapy agents PORT flush    °  °To help prevent nausea and vomiting after your treatment, we encourage you to take your nausea medication as directed. ° °BELOW ARE SYMPTOMS THAT SHOULD BE REPORTED IMMEDIATELY: °*FEVER GREATER THAN 100.4 F (38 °C) OR HIGHER °*CHILLS OR SWEATING °*NAUSEA AND VOMITING THAT IS NOT CONTROLLED WITH YOUR NAUSEA MEDICATION °*UNUSUAL SHORTNESS OF BREATH °*UNUSUAL BRUISING OR BLEEDING °*URINARY PROBLEMS (pain or burning when urinating, or frequent urination) °*BOWEL PROBLEMS (unusual diarrhea, constipation, pain near the anus) °TENDERNESS IN MOUTH AND THROAT WITH OR WITHOUT PRESENCE OF ULCERS (sore throat, sores in mouth, or a toothache) °UNUSUAL RASH, SWELLING OR PAIN  °UNUSUAL VAGINAL DISCHARGE OR ITCHING  ° °Items with * indicate a potential emergency and should be followed up as soon as possible or go to the Emergency Department if any problems should occur. ° °Please show the CHEMOTHERAPY ALERT CARD or IMMUNOTHERAPY ALERT CARD at check-in to the Emergency  Department and triage nurse. ° °Should you have questions after your visit or need to cancel or reschedule your appointment, please contact [facility] [phone]  and follow the prompts.  Office hours are 8:00 a.m. to 4:30 p.m. Monday - Friday. Please note that voicemails left after 4:00 p.m. may not be returned until the following business day.  We are closed weekends and major holidays. You have access to a nurse at all times for urgent questions. Please call the main number to the clinic [phone] and follow the prompts. ° °For any non-urgent questions, you may also contact your provider using MyChart. We now offer e-Visits for anyone 18 and older to request care online for non-urgent symptoms. For details visit [email or web address]. °  °Also download the MyChart app! Go to the app store, search "MyChart", open the app, select , and log in with your MyChart username and password. ° °Due to Covid, a mask is required upon entering the hospital/clinic. If you do not have a mask, one will be given to you upon arrival. For doctor visits, patients may have 1 support person aged 18 or older with them. For treatment visits, patients cannot have anyone with them due to current Covid guidelines and our immunocompromised population.  °

## 2021-08-03 NOTE — Patient Instructions (Signed)
[place] at [facility] Discharge Instructions  You were seen and examined by Dr. Delton Coombes today. Return to clinic after CT scan as scheduled.     Thank you for choosing [place] at [facility] to provide your oncology and hematology care.  To afford each patient quality time with our provider, please arrive at least 15 minutes before your scheduled appointment time.   If you have a lab appointment with the [facility] please come in thru the Main Entrance and check in at the main information desk.  You need to re-schedule your appointment should you arrive 10 or more minutes late.  We strive to give you quality time with our providers, and arriving late affects you and other patients whose appointments are after yours.  Also, if you no show three or more times for appointments you may be dismissed from the clinic at the providers discretion.     Again, thank you for choosing [facility].  Our hope is that these requests will decrease the amount of time that you wait before being seen by our physicians.       _____________________________________________________________  Should you have questions after your visit to [facility], please contact our office at [phone] and follow the prompts.  Our office hours are 8:00 a.m. and 4:30 p.m. Monday - Friday.  Please note that voicemails left after 4:00 p.m. may not be returned until the following business day.  We are closed weekends and major holidays.  You do have access to a nurse 24-7, just call the main number to the clinic [phone] and do not press any options, hold on the line and a nurse will answer the phone.    For prescription refill requests, have your pharmacy contact our office and allow 72 hours.    Due to Covid, you will need to wear a mask upon entering the hospital. If you do not have a mask, a mask will be given to you at the Main Entrance upon  arrival. For doctor visits, patients may have 1 support person age 33 or older with them. For treatment visits, patients can not have anyone with them due to social distancing guidelines and our immunocompromised population.

## 2021-08-05 ENCOUNTER — Telehealth: Payer: Self-pay | Admitting: General Practice

## 2021-08-05 ENCOUNTER — Encounter: Payer: Self-pay | Admitting: General Practice

## 2021-08-05 NOTE — Progress Notes (Signed)
[facility] CSW Progress Notes  Request from Lora Havens RN to connect w patient re any resources for home delivered meals in her county - [place].  [place] [employer] [phone]) re Meals on Wheels, also left VM for Patti w [place] on Aging [phone]) to determine if there any any resources in her county/address.  Per [employer], senior can be transported to a congregate meal site on Wednesdays for food and fellowship if desired.  Awaiting return call re Meals on Wheels or similar.  Left VM for patient/family requesting call to discuss their need for assistance.  Edwyna Shell, LCSW Clinical Social Worker Phone:  [phone]

## 2021-08-05 NOTE — Telephone Encounter (Signed)
[facility] CSW Progress Nnotes  Referral made to [employer] on Aging [phone]) for help w home delivered bag lunch and any other services patient desires.  Agency will reach out to patient/family directly.  Edwyna Shell, LCSW Clinical Social Worker Phone:  [phone]

## 2021-08-05 NOTE — Progress Notes (Signed)
[facility] CSW Progress Notes  Lives w son and daughter in law, she is not able to get out on her own.  She is alone muich of the time, but family members do try to come and help as much as possible.  Spoke w daughter in [place] mother who was currently in the home.  Patient napping.  Explained resources available through [employer] Aging [phone]).  They can deliver bag lunches, can also provide bath/shower aide for 2 hours twice a week.  This is paid for on a sliding scale.  Per relative in the home, patient would like to be contacted by this agency to better understand the program.  Agency asked to call patient/family.  Edwyna Shell, LCSW Clinical Social Worker Phone:  [phone]

## 2021-08-06 ENCOUNTER — Encounter (HOSPITAL_COMMUNITY): Payer: Self-pay | Admitting: General Practice

## 2021-08-06 NOTE — Progress Notes (Signed)
[facility] CSW Progress Notes  Call from Ms Wayne Both, [place] [employer] Aging.  She has contacted the family, unable to speak to patient but did speak to Antony Contras (mother of patient's daughter in law - patient lives w son and daughter in law).  Per Ms Trollinger, patient stays in bed most of the day.  Son cooks on weekends, daughter in law cooks dinner weekdays.  [place] offered resources available from her agency - given where patient lives, they can provide boxed or frozen meals, they do not deliver hot meals in her area.  Patient did not appear to need any additional services available through [employer].  Per Ms Wayne Both, Ms Trollinger stated that "she does not want any services at this time."  Agency attempted to call daughter in law to further clarify, will also mail information to patient/family so they are fully informed about what is available.   Edwyna Shell, LCSW Clinical Social Worker Phone:  [phone]

## 2021-08-10 ENCOUNTER — Other Ambulatory Visit (HOSPITAL_COMMUNITY): Payer: Self-pay | Admitting: *Deleted

## 2021-08-10 DIAGNOSIS — C8330 Diffuse large B-cell lymphoma, unspecified site: Secondary | ICD-10-CM

## 2021-08-11 ENCOUNTER — Other Ambulatory Visit: Payer: Self-pay

## 2021-08-11 ENCOUNTER — Ambulatory Visit (HOSPITAL_BASED_OUTPATIENT_CLINIC_OR_DEPARTMENT_OTHER)
Admission: RE | Admit: 2021-08-11 | Discharge: 2021-08-11 | Disposition: A | Discharge status: Home / Self Care | Payer: Medicare HMO | Source: Ambulatory Visit | Attending: Hematology | Admitting: Hematology

## 2021-08-11 DIAGNOSIS — C8330 Diffuse large B-cell lymphoma, unspecified site: Secondary | ICD-10-CM | POA: Diagnosis present

## 2021-08-12 ENCOUNTER — Inpatient Hospital Stay (HOSPITAL_COMMUNITY): Payer: Medicare HMO | Admitting: Hematology

## 2021-08-16 ENCOUNTER — Encounter (HOSPITAL_COMMUNITY): Payer: Self-pay

## 2021-08-16 ENCOUNTER — Ambulatory Visit (HOSPITAL_BASED_OUTPATIENT_CLINIC_OR_DEPARTMENT_OTHER): Payer: Medicare HMO | Admitting: Hematology

## 2021-08-16 DIAGNOSIS — C8330 Diffuse large B-cell lymphoma, unspecified site: Secondary | ICD-10-CM

## 2021-08-16 NOTE — Progress Notes (Signed)
Virtual Visit via Telephone Note  I connected with Cristina Gregory on 08/16/21 at 11:00 AM EDT by telephone and verified that I am speaking with the correct person using two identifiers.  Location: Patient: At home Provider: In the office   I discussed the limitations, risks, security and privacy concerns of performing an evaluation and management service by telephone and the availability of in person appointments. I also discussed with the patient that there may be a patient responsible charge related to this service. The patient expressed understanding and agreed to proceed.   History of Present Illness: She is followed in our clinic for history of stage IIb diffuse large B-cell lymphoma for which she underwent 4 cycles of R-CHOP which was completed in 2016 October.   Observations/Objective: I have talked to the patient and her daughter.  She has lost about 23 pounds in the last 1 year.  Denies any fevers or night sweats.  Assessment and Plan:  1.  Unexplained weight loss: - We have reviewed CT CAP from 08/11/2021 which did not show any evidence of recurrence of lymphoma.  Other benign findings were explained to the patient and her daughter. - She continues to be in remission, 6 years after completion of chemotherapy. - She may discontinue the Port-A-Cath.  It was apparently placed in [facility].  She will reach out to Dr. Edythe Lynn office for referral. - Patient's daughter was wondering if they can keep the port.  Again they will discuss among themselves and let Dr. Edythe Lynn office know. - I have recommended follow-up in 1 year with repeat labs.   Follow Up Instructions: RTC 1 year with labs.   I discussed the assessment and treatment plan with the patient. The patient was provided an opportunity to ask questions and all were answered. The patient agreed with the plan and demonstrated an understanding of the instructions.   The patient was advised to call back or seek an  in-person evaluation if the symptoms worsen or if the condition fails to improve as anticipated.  I provided 12 minutes of non-face-to-face time during this encounter.   Derek Jack, MD

## 2021-11-17 ENCOUNTER — Other Ambulatory Visit: Payer: Self-pay

## 2021-11-17 ENCOUNTER — Inpatient Hospital Stay (HOSPITAL_COMMUNITY): Payer: Medicare HMO | Attending: Hematology

## 2021-11-17 DIAGNOSIS — Z8572 Personal history of non-Hodgkin lymphomas: Secondary | ICD-10-CM | POA: Diagnosis present

## 2021-11-17 DIAGNOSIS — Z452 Encounter for adjustment and management of vascular access device: Secondary | ICD-10-CM | POA: Diagnosis present

## 2021-11-17 MED ORDER — HEPARIN SOD (PORK) LOCK FLUSH 100 UNIT/ML IV SOLN
500.0000 [IU] | Freq: Once | INTRAVENOUS | Status: AC
  Administered 2021-11-17: 11:00:00 500 [IU] via INTRAVENOUS

## 2021-11-17 MED ORDER — SODIUM CHLORIDE 0.9% FLUSH
10.0000 mL | INTRAVENOUS | Status: DC | PRN
  Administered 2021-11-17: 11:00:00 10 mL via INTRAVENOUS

## 2021-11-17 NOTE — Patient Instructions (Addendum)
[facility] [place]  Discharge Instructions: Thank you for choosing [facility] to provide your oncology and hematology care.  If you have a lab appointment with the [facility], please come in thru the Main Entrance and check in at the main information desk.  Wear comfortable clothing and clothing appropriate for easy access to any Portacath or PICC line.   We strive to give you quality time with your provider. You may need to reschedule your appointment if you arrive late (15 or more minutes).  Arriving late affects you and other patients whose appointments are after yours.  Also, if you miss three or more appointments without notifying the office, you may be dismissed from the clinic at the providers discretion.      For prescription refill requests, have your pharmacy contact our office and allow 72 hours for refills to be completed.    Today you received Port flush.     BELOW ARE SYMPTOMS THAT SHOULD BE REPORTED IMMEDIATELY: *FEVER GREATER THAN 100.4 F (38 C) OR HIGHER *CHILLS OR SWEATING *NAUSEA AND VOMITING THAT IS NOT CONTROLLED WITH YOUR NAUSEA MEDICATION *UNUSUAL SHORTNESS OF BREATH *UNUSUAL BRUISING OR BLEEDING *URINARY PROBLEMS (pain or burning when urinating, or frequent urination) *BOWEL PROBLEMS (unusual diarrhea, constipation, pain near the anus) TENDERNESS IN MOUTH AND THROAT WITH OR WITHOUT PRESENCE OF ULCERS (sore throat, sores in mouth, or a toothache) UNUSUAL RASH, SWELLING OR PAIN  UNUSUAL VAGINAL DISCHARGE OR ITCHING   Items with * indicate a potential emergency and should be followed up as soon as possible or go to the Emergency Department if any problems should occur.  Please show the CHEMOTHERAPY ALERT CARD or IMMUNOTHERAPY ALERT CARD at check-in to the Emergency Department and triage nurse.  Should you have questions after your visit or need to cancel or reschedule your appointment, please contact [facility] [phone]   and follow the prompts.  Office hours are 8:00 a.m. to 4:30 p.m. Monday - Friday. Please note that voicemails left after 4:00 p.m. may not be returned until the following business day.  We are closed weekends and major holidays. You have access to a nurse at all times for urgent questions. Please call the main number to the clinic [phone] and follow the prompts.  For any non-urgent questions, you may also contact your provider using MyChart. We now offer e-Visits for anyone 10 and older to request care online for non-urgent symptoms. For details visit [email or web address].   Also download the MyChart app! Go to the app store, search "MyChart", open the app, select [place], and log in with your MyChart username and password.  Due to Covid, a mask is required upon entering the hospital/clinic. If you do not have a mask, one will be given to you upon arrival. For doctor visits, patients may have 1 support person aged 47 or older with them. For treatment visits, patients cannot have anyone with them due to current Covid guidelines and our immunocompromised population.

## 2021-11-17 NOTE — Progress Notes (Signed)
Cristina Gregory presented for Portacath access and flush.  Portacath located left chest wall accessed with  H 20 needle. Good blood return no swelling or bruising noted at the site. Portacath flushed with 11ml NS and 500U/72ml Heparin and needle removed intact.  Procedure tolerated well and without incident.    Discharged from clinic via wheelchair in stable condition. Alert and oriented x 3. F/U with [facility] as scheduled.

## 2022-02-15 ENCOUNTER — Inpatient Hospital Stay (HOSPITAL_COMMUNITY): Payer: Medicare HMO | Attending: Hematology

## 2022-05-17 ENCOUNTER — Inpatient Hospital Stay (HOSPITAL_COMMUNITY): Payer: Medicare HMO | Attending: Hematology

## 2022-08-16 ENCOUNTER — Ambulatory Visit: Payer: Medicare HMO | Admitting: Hematology

## 2022-08-16 ENCOUNTER — Other Ambulatory Visit: Payer: Medicare HMO

## 2022-10-24 DEATH — deceased
